# Patient Record
Sex: Female | Born: 1935 | Race: White | Hispanic: No | Smoking: Former smoker
Health system: Southern US, Community
[De-identification: ages and names within clinical notes are randomized; demographics above are authoritative.]

## PROBLEM LIST (undated history)

## (undated) DIAGNOSIS — R001 Bradycardia, unspecified: Secondary | ICD-10-CM

## (undated) DIAGNOSIS — E119 Type 2 diabetes mellitus without complications: Secondary | ICD-10-CM

## (undated) DIAGNOSIS — N189 Chronic kidney disease, unspecified: Secondary | ICD-10-CM

## (undated) DIAGNOSIS — F32A Depression, unspecified: Secondary | ICD-10-CM

## (undated) DIAGNOSIS — I10 Essential (primary) hypertension: Secondary | ICD-10-CM

## (undated) DIAGNOSIS — F329 Major depressive disorder, single episode, unspecified: Secondary | ICD-10-CM

## (undated) DIAGNOSIS — H3530 Unspecified macular degeneration: Secondary | ICD-10-CM

## (undated) DIAGNOSIS — M1990 Unspecified osteoarthritis, unspecified site: Secondary | ICD-10-CM

## (undated) DIAGNOSIS — E162 Hypoglycemia, unspecified: Secondary | ICD-10-CM

## (undated) DIAGNOSIS — E039 Hypothyroidism, unspecified: Secondary | ICD-10-CM

## (undated) DIAGNOSIS — G500 Trigeminal neuralgia: Secondary | ICD-10-CM

## (undated) HISTORY — PX: CHOLECYSTECTOMY: SHX55

## (undated) HISTORY — DX: Depression, unspecified: F32.A

## (undated) HISTORY — DX: Trigeminal neuralgia: G50.0

## (undated) HISTORY — PX: APPENDECTOMY: SHX54

## (undated) HISTORY — DX: Hypoglycemia, unspecified: E16.2

## (undated) HISTORY — DX: Unspecified osteoarthritis, unspecified site: M19.90

## (undated) HISTORY — DX: Unspecified macular degeneration: H35.30

## (undated) HISTORY — DX: Bradycardia, unspecified: R00.1

## (undated) HISTORY — DX: Major depressive disorder, single episode, unspecified: F32.9

## (undated) HISTORY — DX: Chronic kidney disease, unspecified: N18.9

## (undated) HISTORY — PX: TONSILLECTOMY: SUR1361

## (undated) HISTORY — DX: Hypothyroidism, unspecified: E03.9

## (undated) HISTORY — DX: Essential (primary) hypertension: I10

---

## 2001-08-02 ENCOUNTER — Encounter: Payer: Self-pay | Admitting: Family Medicine

## 2001-08-02 ENCOUNTER — Ambulatory Visit (HOSPITAL_COMMUNITY): Admission: RE | Admit: 2001-08-02 | Discharge: 2001-08-02 | Payer: Self-pay | Admitting: Family Medicine

## 2005-04-22 ENCOUNTER — Ambulatory Visit: Payer: Self-pay | Admitting: Cardiology

## 2005-04-27 ENCOUNTER — Ambulatory Visit: Payer: Self-pay | Admitting: Cardiology

## 2005-05-02 ENCOUNTER — Ambulatory Visit: Payer: Self-pay | Admitting: Cardiology

## 2010-11-28 ENCOUNTER — Inpatient Hospital Stay (HOSPITAL_COMMUNITY)
Admission: AD | Admit: 2010-11-28 | Discharge: 2010-12-08 | DRG: 308 | Disposition: A | Discharge status: Skilled Nursing Facility | Payer: Medicare Other | Source: Other Acute Inpatient Hospital | Attending: Internal Medicine | Admitting: Internal Medicine

## 2010-11-28 DIAGNOSIS — E039 Hypothyroidism, unspecified: Secondary | ICD-10-CM | POA: Diagnosis present

## 2010-11-28 DIAGNOSIS — G929 Unspecified toxic encephalopathy: Secondary | ICD-10-CM | POA: Diagnosis not present

## 2010-11-28 DIAGNOSIS — H3530 Unspecified macular degeneration: Secondary | ICD-10-CM | POA: Diagnosis present

## 2010-11-28 DIAGNOSIS — G500 Trigeminal neuralgia: Secondary | ICD-10-CM | POA: Diagnosis present

## 2010-11-28 DIAGNOSIS — M722 Plantar fascial fibromatosis: Secondary | ICD-10-CM | POA: Diagnosis present

## 2010-11-28 DIAGNOSIS — N184 Chronic kidney disease, stage 4 (severe): Secondary | ICD-10-CM | POA: Diagnosis present

## 2010-11-28 DIAGNOSIS — G92 Toxic encephalopathy: Secondary | ICD-10-CM | POA: Diagnosis not present

## 2010-11-28 DIAGNOSIS — I129 Hypertensive chronic kidney disease with stage 1 through stage 4 chronic kidney disease, or unspecified chronic kidney disease: Secondary | ICD-10-CM | POA: Diagnosis present

## 2010-11-28 DIAGNOSIS — E119 Type 2 diabetes mellitus without complications: Secondary | ICD-10-CM | POA: Diagnosis present

## 2010-11-28 DIAGNOSIS — Z888 Allergy status to other drugs, medicaments and biological substances status: Secondary | ICD-10-CM

## 2010-11-28 DIAGNOSIS — N390 Urinary tract infection, site not specified: Secondary | ICD-10-CM | POA: Diagnosis not present

## 2010-11-28 DIAGNOSIS — I498 Other specified cardiac arrhythmias: Principal | ICD-10-CM | POA: Diagnosis present

## 2010-11-28 DIAGNOSIS — Z880 Allergy status to penicillin: Secondary | ICD-10-CM

## 2010-11-28 DIAGNOSIS — Z79899 Other long term (current) drug therapy: Secondary | ICD-10-CM

## 2010-11-28 DIAGNOSIS — Z87891 Personal history of nicotine dependence: Secondary | ICD-10-CM

## 2010-11-28 DIAGNOSIS — Z882 Allergy status to sulfonamides status: Secondary | ICD-10-CM

## 2010-11-28 LAB — BASIC METABOLIC PANEL
BUN: 33 mg/dL — ABNORMAL HIGH (ref 6–23)
CO2: 28 mEq/L (ref 19–32)
Calcium: 11.8 mg/dL — ABNORMAL HIGH (ref 8.4–10.5)
Chloride: 100 mEq/L (ref 96–112)
Creatinine, Ser: 1.36 mg/dL — ABNORMAL HIGH (ref 0.50–1.10)
GFR calc Af Amer: 39 mL/min — ABNORMAL LOW (ref >90)
GFR calc non Af Amer: 34 mL/min — ABNORMAL LOW (ref >90)
Glucose, Bld: 150 mg/dL — ABNORMAL HIGH (ref 70–99)
Potassium: 3.7 mEq/L (ref 3.5–5.1)
Sodium: 139 mEq/L (ref 135–145)

## 2010-11-28 LAB — CARDIAC PANEL(CRET KIN+CKTOT+MB+TROPI)
CK, MB: 4.4 ng/mL — ABNORMAL HIGH (ref 0.3–4.0)
Relative Index: 2 (ref 0.0–2.5)
Total CK: 215 U/L — ABNORMAL HIGH (ref 7–177)
Troponin I: <0.3 ng/mL (ref <0.30)

## 2010-11-28 LAB — CBC
HCT: 36.4 % (ref 36.0–46.0)
Hemoglobin: 12.6 g/dL (ref 12.0–15.0)
MCH: 32.7 pg (ref 26.0–34.0)
MCHC: 34.6 g/dL (ref 30.0–36.0)
MCV: 94.5 fL (ref 78.0–100.0)
Platelets: 138 10*3/uL — ABNORMAL LOW (ref 150–400)
RBC: 3.85 MIL/uL — ABNORMAL LOW (ref 3.87–5.11)
RDW: 13.3 % (ref 11.5–15.5)
WBC: 6.6 10*3/uL (ref 4.0–10.5)

## 2010-11-28 LAB — TSH: TSH: 6.741 u[IU]/mL — ABNORMAL HIGH (ref 0.350–4.500)

## 2010-11-28 LAB — MRSA PCR SCREENING: MRSA by PCR: NEGATIVE

## 2010-11-28 LAB — GLUCOSE, CAPILLARY: Glucose-Capillary: 141 mg/dL — ABNORMAL HIGH (ref 70–99)

## 2010-11-29 DIAGNOSIS — R0989 Other specified symptoms and signs involving the circulatory and respiratory systems: Secondary | ICD-10-CM

## 2010-11-29 DIAGNOSIS — I059 Rheumatic mitral valve disease, unspecified: Secondary | ICD-10-CM

## 2010-11-29 DIAGNOSIS — I498 Other specified cardiac arrhythmias: Secondary | ICD-10-CM

## 2010-11-29 LAB — CBC
HCT: 33 % — ABNORMAL LOW (ref 36.0–46.0)
Hemoglobin: 11.3 g/dL — ABNORMAL LOW (ref 12.0–15.0)
MCV: 94.8 fL (ref 78.0–100.0)
RDW: 13.4 % (ref 11.5–15.5)
WBC: 6.2 10*3/uL (ref 4.0–10.5)

## 2010-11-29 LAB — T3, FREE: T3, Free: 2.2 pg/mL — ABNORMAL LOW (ref 2.3–4.2)

## 2010-11-29 LAB — BASIC METABOLIC PANEL
BUN: 32 mg/dL — ABNORMAL HIGH (ref 6–23)
Chloride: 104 mEq/L (ref 96–112)
Creatinine, Ser: 1.33 mg/dL — ABNORMAL HIGH (ref 0.50–1.10)
Glucose, Bld: 91 mg/dL (ref 70–99)
Potassium: 3.7 mEq/L (ref 3.5–5.1)

## 2010-11-29 LAB — COMPREHENSIVE METABOLIC PANEL
ALT: 11 U/L (ref 0–35)
AST: 31 U/L (ref 0–37)
Albumin: 2.8 g/dL — ABNORMAL LOW (ref 3.5–5.2)
Alkaline Phosphatase: 88 U/L (ref 39–117)
Chloride: 102 mEq/L (ref 96–112)
Potassium: 4 mEq/L (ref 3.5–5.1)
Sodium: 140 mEq/L (ref 135–145)
Total Bilirubin: 0.4 mg/dL (ref 0.3–1.2)
Total Protein: 6.2 g/dL (ref 6.0–8.3)

## 2010-11-29 LAB — T4, FREE: Free T4: 0.69 ng/dL — ABNORMAL LOW (ref 0.80–1.80)

## 2010-11-29 LAB — PROTIME-INR
INR: 1.08 (ref 0.00–1.49)
Prothrombin Time: 14.2 seconds (ref 11.6–15.2)

## 2010-11-29 LAB — GLUCOSE, CAPILLARY
Glucose-Capillary: 121 mg/dL — ABNORMAL HIGH (ref 70–99)
Glucose-Capillary: 117 mg/dL — ABNORMAL HIGH (ref 70–99)

## 2010-11-30 ENCOUNTER — Inpatient Hospital Stay (HOSPITAL_COMMUNITY): Payer: Medicare Other

## 2010-11-30 LAB — GLUCOSE, CAPILLARY
Glucose-Capillary: 128 mg/dL — ABNORMAL HIGH (ref 70–99)
Glucose-Capillary: 158 mg/dL — ABNORMAL HIGH (ref 70–99)

## 2010-12-01 DIAGNOSIS — M7989 Other specified soft tissue disorders: Secondary | ICD-10-CM

## 2010-12-01 DIAGNOSIS — M79609 Pain in unspecified limb: Secondary | ICD-10-CM

## 2010-12-01 LAB — URINALYSIS, ROUTINE W REFLEX MICROSCOPIC
Bilirubin Urine: NEGATIVE
Ketones, ur: 15 mg/dL — AB
Nitrite: NEGATIVE
Protein, ur: 30 mg/dL — AB
pH: 6 (ref 5.0–8.0)

## 2010-12-01 LAB — URINE MICROSCOPIC-ADD ON

## 2010-12-01 LAB — CBC
MCHC: 34.8 g/dL (ref 30.0–36.0)
MCV: 93.1 fL (ref 78.0–100.0)
Platelets: 138 10*3/uL — ABNORMAL LOW (ref 150–400)
RDW: 12.8 % (ref 11.5–15.5)
WBC: 13.1 10*3/uL — ABNORMAL HIGH (ref 4.0–10.5)

## 2010-12-01 LAB — BASIC METABOLIC PANEL
Calcium: 10 mg/dL (ref 8.4–10.5)
Chloride: 100 mEq/L (ref 96–112)
Creatinine, Ser: 1.08 mg/dL (ref 0.50–1.10)
GFR calc Af Amer: 52 mL/min — ABNORMAL LOW (ref >90)
GFR calc non Af Amer: 44 mL/min — ABNORMAL LOW (ref >90)

## 2010-12-01 LAB — GLUCOSE, CAPILLARY
Glucose-Capillary: 160 mg/dL — ABNORMAL HIGH (ref 70–99)
Glucose-Capillary: 155 mg/dL — ABNORMAL HIGH (ref 70–99)
Glucose-Capillary: 154 mg/dL — ABNORMAL HIGH (ref 70–99)
Glucose-Capillary: 175 mg/dL — ABNORMAL HIGH (ref 70–99)

## 2010-12-02 DIAGNOSIS — I4891 Unspecified atrial fibrillation: Secondary | ICD-10-CM

## 2010-12-02 LAB — URINE CULTURE
Colony Count: >=100000
Culture  Setup Time: 201210100938

## 2010-12-02 LAB — GLUCOSE, CAPILLARY
Glucose-Capillary: 184 mg/dL — ABNORMAL HIGH (ref 70–99)
Glucose-Capillary: 169 mg/dL — ABNORMAL HIGH (ref 70–99)

## 2010-12-02 LAB — CBC
MCH: 32.2 pg (ref 26.0–34.0)
MCHC: 34.7 g/dL (ref 30.0–36.0)
Platelets: 139 10*3/uL — ABNORMAL LOW (ref 150–400)
RBC: 3.39 MIL/uL — ABNORMAL LOW (ref 3.87–5.11)
RDW: 13.1 % (ref 11.5–15.5)

## 2010-12-02 LAB — BASIC METABOLIC PANEL
Calcium: 9.5 mg/dL (ref 8.4–10.5)
GFR calc Af Amer: 52 mL/min — ABNORMAL LOW (ref >90)
GFR calc non Af Amer: 45 mL/min — ABNORMAL LOW (ref >90)
Sodium: 140 mEq/L (ref 135–145)

## 2010-12-03 DIAGNOSIS — I495 Sick sinus syndrome: Secondary | ICD-10-CM

## 2010-12-03 LAB — GLUCOSE, CAPILLARY
Glucose-Capillary: 134 mg/dL — ABNORMAL HIGH (ref 70–99)
Glucose-Capillary: 174 mg/dL — ABNORMAL HIGH (ref 70–99)
Glucose-Capillary: 186 mg/dL — ABNORMAL HIGH (ref 70–99)

## 2010-12-03 LAB — DIFFERENTIAL
Basophils Absolute: 0 10*3/uL (ref 0.0–0.1)
Basophils Relative: 0 % (ref 0–1)
Lymphocytes Relative: 24 % (ref 12–46)
Monocytes Absolute: 0.7 10*3/uL (ref 0.1–1.0)
Monocytes Relative: 8 % (ref 3–12)
Neutro Abs: 6.4 10*3/uL (ref 1.7–7.7)
Neutrophils Relative %: 67 % (ref 43–77)

## 2010-12-03 LAB — BASIC METABOLIC PANEL
BUN: 20 mg/dL (ref 6–23)
CO2: 27 mEq/L (ref 19–32)
Chloride: 101 mEq/L (ref 96–112)
GFR calc non Af Amer: 47 mL/min — ABNORMAL LOW (ref >90)
Glucose, Bld: 138 mg/dL — ABNORMAL HIGH (ref 70–99)
Potassium: 3.5 mEq/L (ref 3.5–5.1)
Sodium: 137 mEq/L (ref 135–145)

## 2010-12-03 LAB — CBC
HCT: 30.9 % — ABNORMAL LOW (ref 36.0–46.0)
Hemoglobin: 10.8 g/dL — ABNORMAL LOW (ref 12.0–15.0)
MCHC: 35 g/dL (ref 30.0–36.0)
RBC: 3.36 MIL/uL — ABNORMAL LOW (ref 3.87–5.11)

## 2010-12-04 LAB — GLUCOSE, CAPILLARY: Glucose-Capillary: 142 mg/dL — ABNORMAL HIGH (ref 70–99)

## 2010-12-05 LAB — GLUCOSE, CAPILLARY
Glucose-Capillary: 160 mg/dL — ABNORMAL HIGH (ref 70–99)
Glucose-Capillary: 186 mg/dL — ABNORMAL HIGH (ref 70–99)
Glucose-Capillary: 190 mg/dL — ABNORMAL HIGH (ref 70–99)
Glucose-Capillary: 170 mg/dL — ABNORMAL HIGH (ref 70–99)

## 2010-12-06 LAB — GLUCOSE, CAPILLARY
Glucose-Capillary: 150 mg/dL — ABNORMAL HIGH (ref 70–99)
Glucose-Capillary: 161 mg/dL — ABNORMAL HIGH (ref 70–99)

## 2010-12-07 ENCOUNTER — Inpatient Hospital Stay (HOSPITAL_COMMUNITY): Payer: Medicare Other

## 2010-12-07 ENCOUNTER — Telehealth: Payer: Self-pay | Admitting: Internal Medicine

## 2010-12-07 LAB — GLUCOSE, CAPILLARY
Glucose-Capillary: 154 mg/dL — ABNORMAL HIGH (ref 70–99)
Glucose-Capillary: 143 mg/dL — ABNORMAL HIGH (ref 70–99)

## 2010-12-07 LAB — CULTURE, BLOOD (ROUTINE X 2): Culture: NO GROWTH

## 2010-12-07 NOTE — Telephone Encounter (Signed)
Pt daughter calling to speak with Dr. Graciela Husbands and/or nurse. Please call back.

## 2010-12-07 NOTE — Telephone Encounter (Signed)
I spoke with the patient's daughter. She is returning a call to Dr. Graciela Husbands regarding her mother.  She is scheduled for d/c tomorrow. Please call at contact number left in message. I will forward to Dr. Graciela Husbands.

## 2010-12-08 LAB — GLUCOSE, CAPILLARY: Glucose-Capillary: 166 mg/dL — ABNORMAL HIGH (ref 70–99)

## 2010-12-12 NOTE — Consult Note (Signed)
NAME:  Jodi Ferguson, Jodi Ferguson NO.:  [id number]  MEDICAL RECORD NO.:  [id number]  LOCATION:  2923                         FACILITY:  [facility]  PHYSICIAN:  Thana Farr, MD    DATE OF BIRTH:  May 31, 1920  DATE OF CONSULTATION:  11/29/2010 DATE OF DISCHARGE:                                CONSULTATION   REASON FOR CONSULTATION:  Bradycardia with a question of the cause could be Lyrica.  HISTORY OF PRESENT ILLNESS:  This is an 77 year old female with past medical history of hypertension, trigeminal neuralgia, macular degeneration, chronic kidney disease, depression, osteoarthritis and bradycardia resulting in discontinued beta-blocker just recently.  The patient has had trigeminal neuralgia for multiple years per family members.  However, approximately about 4 months ago, the patient was increased from Lyrica 50 mg daily to 50 mg b.i.d.  At approximately 1 month ago, she was increased to Lyrica 50 mg 2 tabs in the morning and 2 tabs at night.  In addition, the patient was also recently taken off the metformin secondary to worsening renal function and her pain medication of OxyContin was changed from 2 tabs at night and 1 tab in the morning and 1 tab in the evening.  The patient was recently seen by her primary care physician after there was noted bradycardia and syncopal episodes. At that time, there is question of increased dosage of Lyrica could be causing his bradycardia.  The patient was transferred to [facility] for further evaluation by cardiologist.  Neurology was asked to see the patient and evaluate for possible medication side effects of Lyrica.  PAST MEDICAL HISTORY:  As noted above.  MEDICATIONS:  She is on vancomycin, Benicar, Lyrica 50 mg p.o. daily at this time, Lasix, NovoLog and gentamicin.  ALLERGIES:  PENICILLIN, CODEINE and SULFA.  SOCIAL HISTORY:  The patient does not smoke, drink or do illicit drugs. She lives alone.  REVIEW OF SYSTEMS:  Negative  with the exception of above.  PHYSICAL EXAMINATION:  Blood pressure ranges between systolic 94-163/37- 52, pulse 38, respirations 20, temperature 98.4 to 97.7.  The patient is alert and oriented x3.  Carries out 2-3 step commands with difficulty. Pupils are equal, round, reactive to light and accommodating conjugate, extraocular movements intact, visual fields grossly intact.  Face symmetrical.  Tongue is midline.  Uvula is midline.  Facial sensation is full to pinprick, light touch, shoulder shrug and head turn is within normal limits.  Coordination, finger-to-nose, heel-to-shin is smooth. Motor is 5/5 throughout.  Deep tendon reflexes 2+ throughout downgoing toes.  The patient shows no drift in the upper or lower extremities. Pulmonary is clear to auscultation bilaterally.  Cardiovascular, S1-S2 is audible.  Sensation is full to pinprick light touch.  LABORATORY DATA:  Calcium is 10.8, TSH is 6.741, sodium 143 potassium 3.7 chloride 104, CO2 is 31 BUN 32, creatinine 1.33, glucose ranged from 91-150.  White blood cell count is 6.2, platelets 128, hemoglobin 11.3 and hematocrit 33.0.  IMAGING:  Carotid Dopplers are negative.  The patient has had 2 recent MRI of both on the 5th and November 28, 2010, which showed no acute stroke.  ASSESSMENT:  This is an 77 year old female who has been on Lyrica for  multiple years.  However, has recently increased the dose to 100 mg b.i.d. in the setting of decreased renal function and changing the schedule of her narcotics from 2 pills at night to one in the morning and one in the evening.  The patient has been noted to have increased bradycardia and syncopal episodes.  Lyrica can cause bradycardia.  Recomendations: 1. Start Tegretol 100 mg b.i.d. p.o.  2. Discontinue Lyrica  3. Obtain    LFTs to confirm that her liver function is within normal limits.     4. If the patient begins to improve on Tegretol, we may increase her dose slowly over a     period of weeks.    Dr. Thad Ranger was seen and evaluated this patient.  She agrees to the above mentioned.     Felicie Morn, PA-C   ______________________________ Thana Farr, MD    DS/MEDQ  D:  11/29/2010  T:  11/30/2010  Job:  [id number]  Electronically Signed by Felicie Morn PA-C on 12/01/2010 02:40:05 PM Electronically Signed by Thana Farr MD on 12/12/2010 10:11:53 AM

## 2010-12-16 NOTE — Discharge Summary (Addendum)
  NAMEMarland Kitchen  RAYNA, BRENNER NO.:  [id number]  MEDICAL RECORD NO.:  [id number]  LOCATION:  2036                         FACILITY:  [facility]  PHYSICIAN:  Duke Salvia, MD, FACCDATE OF BIRTH:  Oct 29, 1920  DATE OF ADMISSION:  11/28/2010 DATE OF DISCHARGE:                              DISCHARGE SUMMARY   ADDENDUM:  DISCHARGE MEDICATIONS: 1. Amlodipine 10 mg daily. 2. Levothyroxine 25 mcg daily before breakfast. 3. Benicar 20 mg daily. 4. Lasix 20 mg daily. 5. Calcium carbonate/vitamin D 600 mg OTC 1 tablet daily. 6. Fish oil OTC 1 capsule daily. 7. Hydrocodone/APAP 5/325 mg 1 tablet b.i.d. p.r.n. pain. 8. I-Caps 1 tablet daily. 9. Multivitamin 1 tablet daily. 10.Vitamin E OTC 1 capsule daily.     Ronie Spies, P.A.C.   ______________________________ Duke Salvia, MD, [facility]    DD/MEDQ  D:  12/08/2010  T:  12/08/2010  Job:  [id number]  cc:   [facility]  Electronically Signed by Ronie Spies  on 12/16/2010 10:27:25 AM Electronically Signed by Sherryl Manges MD FACC on 12/17/2010 07:27:03 AM

## 2010-12-16 NOTE — Discharge Summary (Addendum)
NAMEMarland Kitchen  Jodi Ferguson, Jodi Ferguson NO.:  [id number]  MEDICAL RECORD NO.:  [id number]  LOCATION:  2036                         FACILITY:  [facility]  PHYSICIAN:  Duke Salvia, MD, FACCDATE OF BIRTH:  1920-10-18  DATE OF ADMISSION:  11/28/2010 DATE OF DISCHARGE:  12/08/2010                              DISCHARGE SUMMARY   DISCHARGE DIAGNOSES: 1. Bradycardia, resolved. 2. Trigeminal neuralgia. 3. Foot pain felt consistent with plantar fasciitis. 4. Acute mental status secondary to toxic metabolic encephalopathy. 5. Urinary tract infection, treated with ciprofloxacin. 6. Newly diagnosed hypothyroidism, initiated on levothyroxine. 7. Hypertension. 8. Diabetes mellitus. 9. Macular degeneration. 10.Chronic kidney disease, stage 4.  HOSPITAL COURSE:  Jodi Ferguson is an 77 year old lady with a history of hypertension and diabetes who presented initially to [facility] with complaints of weakness and possible falls.  She was seen as an outpatient and had atrophy shown on MRI with question of carotid stenosis.  She was also found to have a heart rate that was low, so beta- blocker was discontinued.  However, upon arrival to the [facility],  heart rate was persistently low, as low as 35.  Blood pressure was continued to be okay in the 130s-140s, however, the patient was symptomatic.  She was felt to require further evaluation by Electrophysiology and thus transferred over to [facility] for further evaluation.  Carotid Dopplers were negative.  The patient's daughter had discussed with Dr. Graciela Husbands that the patient's recent decline in functional status correlated with increase in her Lyrica.  Thus, she was seen by Neurology for possible alternatives to her treatment for trigeminal neuralgia, they recommended to discontinue the Lyrica and initiate Tegretol.  During this hospitalization, the patient had several other issues.  She had some lower extremity warmth and  pain.  Bilateral lower extremity Dopplers were negative and she was initially started on antibiotics for cellulitis, but the Hospitalist Service does not feel that her physical exam was consistent with cellulitis.  They discontinued vancomycin, however, did note a UTI in her urinalysis and she was placed on Cipro for 5 days.  She had evidence of toxic metabolic encephalopathy which was felt to be contributing to her altered mental status, and it was suspected that this was possibly aggravated by her UTI plus or minus Tegretol which was also discontinued.  Her mental status began to improve and her bradycardia improved as well.  Echo was noted to show LV dysfunction by Dr. Graciela Husbands which is being confirmed at this time.  She was also seen by Physical Therapy and Orthopedic regarding foot pain and felt to have plantar fasciitis with instructions for avoiding NSAIDS because of renal function and following up with her primary care doctor in [place].  They also feel that she would benefit from stretching exercises daily and would avoid IV secondary to probable diabetic neuropathy.  After evaluation by PT, it was felt that the patient would benefit from SNF placement.  Bed became available today and the patient was able to ambulate.  She denies any chest pain, shortness of breath.  Dr. Graciela Husbands has seen and examined her today and feels she is stable for discharge.  DISCHARGE LABORATORY DATA:  Sodium 135, potassium 3.5,  chloride 101, CO2 21, glucose 138, BUN 20, creatinine 1.03.  WBC 9.5, hemoglobin 10.8, hematocrit 40.9, and platelet count 143.  Blood cultures were negative. Urine culture appeared contaminated although initially was felt to represent a UTI by the Hospitalist Team.  TSH 6.741 with free T4 of 0.69 and free T3 of 2.2.  Please note her hypothyroidism was also a new diagnosis.  STUDIES: 1. Foot x-ray, December 07, 2010 showed osteopenia without acute     abnormality. 2. 2-D  echocardiogram, results are pending at this time.  DISCHARGE MEDICATION:  To be dictated separately after discussion with Dr. Graciela Husbands.  DISPOSITION:  Jodi Ferguson will be discharged in stable condition to the skilled nursing facility.  She is to follow a low-sodium, heart- healthy diet, and keep her IV site clean and dry.  See above for Orthopedic Service recommendations.  Our office will call her in the [facility] in [place] building for an appointment for followup. She is also to follow up with PCP regarding her foot pain and further monitoring of her thyroid function.  DURATION OF DISCHARGE ENCOUNTER:  Greater than 30 minutes including physician and PA time.     Ronie Spies, P.A.C.   ______________________________ Duke Salvia, MD, [facility]    DD/MEDQ  D:  12/08/2010  T:  12/08/2010  Job:  [id number]  cc:   [facility]  Electronically Signed by Ronie Spies  on 12/16/2010 10:27:21 AM Electronically Signed by Sherryl Manges MD FACC on 12/17/2010 07:26:59 AM

## 2010-12-21 NOTE — Consult Note (Signed)
NAMEMarland Kitchen  SRINIDHI, LANDERS NO.:  [id number]  MEDICAL RECORD NO.:  [id number]  LOCATION:  2923                         FACILITY:  [facility]  PHYSICIAN:  Peggye Pitt, M.D. DATE OF BIRTH:  [birth year]  DATE OF CONSULTATION:  12/01/2010 DATE OF DISCHARGE:                                CONSULTATION   REQUESTING PHYSICIAN:  Dr. Graciela Husbands with [facility].  REASON FOR CONSULTATION:  Mrs. Bethards is an 77 year old white woman who was transferred from [facility] on October 7th after being found to have a profound asymptomatic bradycardia.  Thoughts were that she would probably need a permanent pacemaker.  However, further workup has shown that she had recently had an aggressive increase in her Lyrica dose that could have precipitated her bradycardia.  Neurology was consulted.  Lyrica was discontinued and she was started on Tegretol 100 mg twice daily for her trigeminal neurology.  She has not had any further episodes of jaw or face pain since.  It has been noted over the course of the past 24 hours, the patient has become more lethargic with low-grade temperatures and because of this, Dr. Graciela Husbands has requested our assistance with consultation.  Per his note today, it appears that he has started vancomycin for potential right foot cellulitis.  Dopplers of that extremity have been done and have returned negative for DVT.  She continues to have sinus bradycardia, although her heart rate has improved to the mid to high 60s.  ALLERGIES:  She has stated allergies to PENICILLIN.  PAST MEDICAL HISTORY:  Significant for trigeminal neuralgia, hypertension, and diabetes mellitus as per daughter.  CURRENT MEDICATIONS: 1. Tegretol 100 mg twice daily. 2. Lasix 20 mg daily. 3. NovoLog sliding scale. 4. Synthroid 25 mcg daily. 5. Multivitamin 1 tablet daily. 6. Benicar 20 mg daily. 7. Vancomycin per pharmacy protocol.  SOCIAL HISTORY:  She used to be a smoker, quit  over 50 years ago.  No alcohol or illicit drug use per daughter's report.  The patient is too lethargic to answer questions at this time.  FAMILY HISTORY:  Noncontributory as per daughter's recollection.  REVIEW OF SYSTEMS:  I am unable to obtain given lethargy of the patient.  PHYSICAL EXAM:  VITALS ON DAY OF CONSULTATION:  Blood pressure 153/49, heart rate 60, respirations 24, sats of 93% on 2 L with a T-max of 99.3. GENERAL:  She is lethargic, arouses to a vigorous sternal rub, however, cannot answer questions. NECK:  Supple.  No JVD.  No lymphadenopathy.  No bruits.  No goiter. HEART:  Bradycardic, but with a regular rhythm.  No murmurs, rubs, or gallops that I could not auscultate. LUNGS:  Clear to auscultation bilaterally.  ABDOMEN:  Soft, nontender, nondistended with positive bowel sounds. EXTREMITIES:  She has right greater than left lower extremity edema.  No erythema noted.  LABORATORY DATA:  Labs today include a sodium of 138, potassium 3.7, chloride 100, bicarb 27, BUN 19, creatinine 1.08, glucose of 153.  WBCs 13.1, hemoglobin 12.7, and a platelet count of 138.  A chest x-ray on October 9th that shows bibasilar atelectasis and cardiomegaly, but otherwise no acute disease.  A right foot x-ray that shows a remote 5th metatarsal  fracture.  There is severe osteopenia.  Soft tissue swelling about the midfoot and forefoot, however, no evidence of acute or subacute fracture.  ASSESSMENT/PLAN: 1. Metabolic encephalopathy. 2. Urinary tract infection. 3. Sinus bradycardia, improving, off of Lyrica. 4. New diagnosis of hypothyroidism. 5. Trigeminal neuralgia. 6. Diabetes mellitus.  PLAN:  At this point, I see no evidence of cellulitis of her leg.  I will elect to discontinue vancomycin at this time.  She does have signs of UTI on her urinalysis.  I will place her on IV Cipro pending culture data.  However, I believe that the main cause for her encephalopathy is the Tegretol  that was recently started 2 days ago.  This would coincide with the beginning of her altered mentation.  I have discussed this with Dr. Thad Ranger with Neurology and we have decided to discontinue her Tegretol at this time to see if this helps clear up her encephalopathy.  Thank you for this consultation and we will follow along with you.     Peggye Pitt, M.D.     EH/MEDQ  D:  12/01/2010  T:  12/01/2010  Job:  [id number]  cc:   Duke Salvia, MD, [facility]  Electronically Signed by Peggye Pitt M.D. on 12/21/2010 06:20:54 PM

## 2010-12-28 ENCOUNTER — Encounter: Payer: Self-pay | Admitting: *Deleted

## 2010-12-28 ENCOUNTER — Encounter: Payer: 59 | Admitting: Cardiovascular Disease

## 2010-12-31 ENCOUNTER — Ambulatory Visit (INDEPENDENT_AMBULATORY_CARE_PROVIDER_SITE_OTHER): Payer: Medicare Other | Admitting: Cardiovascular Disease

## 2010-12-31 ENCOUNTER — Encounter: Payer: Self-pay | Admitting: Cardiovascular Disease

## 2010-12-31 DIAGNOSIS — I498 Other specified cardiac arrhythmias: Secondary | ICD-10-CM

## 2010-12-31 DIAGNOSIS — I10 Essential (primary) hypertension: Secondary | ICD-10-CM

## 2010-12-31 DIAGNOSIS — R001 Bradycardia, unspecified: Secondary | ICD-10-CM | POA: Insufficient documentation

## 2010-12-31 NOTE — Progress Notes (Signed)
HPI  This is an 77 year old female who is here today for a followup visit after recent hospitalization. The patient initially presented to [facility] emergency room with recurrent falls. She was noted to be bradycardic with a heart rate of 35 beats per minute. She was transferred to [facility]. She did not have an acute coronary syndrome. The falls were felt to be possibly due to Lyrica which was discontinued. She was started on Tegretol instead for trigeminal neuralgia which was discontinued later due to mental status changes. His bradycardia was felt to be medication related as well as newly diagnosed hypothyroidism. Metoprolol was discontinued and she was started on thyroid replacement therapy. Her bradycardia resolved. She also had urinary tract infection. She had an echocardiogram done which showed normal LV systolic function without significant valvular abnormalities. She was discharged to skilled nursing facility where she stayed for about a week. She is now back home. She denies any chest pain. There is no dizziness, syncope or presyncope.  Allergies  Allergen Reactions  . Codeine Nausea Only  . Penicillins Rash  . Sulfa Antibiotics Rash     Current Outpatient Prescriptions on File Prior to Visit  Medication Sig Dispense Refill  . HYDROcodone-acetaminophen (NORCO) 5-325 MG per tablet Take 1 tablet by mouth 2 (two) times daily as needed.        Marland Kitchen levothyroxine (SYNTHROID, LEVOTHROID) 25 MCG tablet Take 25 mcg by mouth daily.        . Multiple Vitamins-Minerals (ICAPS PO) Take 1 tablet by mouth daily.           Past Medical History  Diagnosis Date  . Osteoarthritis   . Chronic depressive disorder   . Trigeminal neuralgia   . Diabetic retinopathy   . Macular degeneration   . Bradycardia     betablocker stopped due to this. Mild hypothyroidism  . Diabetes mellitus   . Hypoglycemia     requiring hospitalization and mediation adjustment,sulfonylurea stopped  . Chronic kidney  disease     stage IV  . Hypothyroidism   . Hypertension      Past Surgical History  Procedure Date  . Cholecystectomy   . Appendectomy      History reviewed. No pertinent family history.   History   Social History  . Marital Status: Married    Spouse Name: N/A    Number of Children: N/A  . Years of Education: N/A   Occupational History  . RETIRED    Social History Main Topics  . Smoking status: Former Smoker -- 1.0 packs/day for 30 years    Types: Cigarettes    Quit date: 02/21/1958  . Smokeless tobacco: Never Used  . Alcohol Use: No  . Drug Use: Not on file  . Sexually Active: Not on file   Other Topics Concern  . Not on file   Social History Narrative   Spent a great deal of her time taking care of her husband whom has dementia and bipolar.     PHYSICAL EXAM   BP 177/79  Pulse 82  Ht 4\' 10"  (1.473 m)  Wt 144 lb (65.318 kg)  BMI 30.10 kg/m2  Constitutional: She is oriented to person, place, and time. She appears well-developed and well-nourished. No distress.  HENT: No nasal discharge.  Head: Normocephalic and atraumatic.  Eyes: Pupils are equal, round, and reactive to light. Right eye exhibits no discharge. Left eye exhibits no discharge.  Neck: Normal range of motion. Neck supple. No JVD present. No thyromegaly  present.  Cardiovascular: Normal rate, regular rhythm, normal heart sounds. Exam reveals no gallop and no friction rub.  No murmur heard.  Pulmonary/Chest: Effort normal and breath sounds normal. No stridor. No respiratory distress. She has no wheezes. She has no rales. She exhibits no tenderness.  Abdominal: Soft. Bowel sounds are normal. She exhibits no distension. There is no tenderness. There is no rebound and no guarding.  Musculoskeletal: Normal range of motion. She exhibits trace edema and no tenderness.  Neurological: She is alert and oriented to person, place, and time. Coordination normal.  Skin: Skin is warm and dry. No rash noted.  She is not diaphoretic. No erythema. No pallor.  Psychiatric: She has a normal mood and affect. Her behavior is normal. Judgment and thought content normal.      ASSESSMENT AND PLAN

## 2010-12-31 NOTE — Assessment & Plan Note (Signed)
This was likely due to metoprolol and mild hypothyroidism. It is completely resolved now. Her echocardiogram showed normal LV systolic function. I recommend avoiding agents that can affect AV conduction such as beta blockers or calcium channel blockers. Continue to treat the hypothyroidism and adjust the dose as needed. She follows up with Dr. Dimas Aguas for this.  There is no indication for a permanent pacemaker. The patient can followup with [place] as needed.

## 2010-12-31 NOTE — Assessment & Plan Note (Signed)
Her blood pressure is elevated today. I reviewed her home blood pressure readings which were all below 150 systolic. Thus, no changes will be made today. If her blood pressure remains elevated, consider adding amlodipine.

## 2010-12-31 NOTE — Patient Instructions (Signed)
Continue all current medications. Follow up as needed  

## 2011-06-13 ENCOUNTER — Emergency Department (HOSPITAL_COMMUNITY)
Admission: EM | Admit: 2011-06-13 | Discharge: 2011-06-13 | Disposition: A | Discharge status: Home / Self Care | Payer: Medicare Other | Attending: Emergency Medicine | Admitting: Emergency Medicine

## 2011-06-13 ENCOUNTER — Encounter (HOSPITAL_COMMUNITY): Payer: Self-pay | Admitting: *Deleted

## 2011-06-13 DIAGNOSIS — F329 Major depressive disorder, single episode, unspecified: Secondary | ICD-10-CM | POA: Insufficient documentation

## 2011-06-13 DIAGNOSIS — F3289 Other specified depressive episodes: Secondary | ICD-10-CM | POA: Insufficient documentation

## 2011-06-13 DIAGNOSIS — N184 Chronic kidney disease, stage 4 (severe): Secondary | ICD-10-CM | POA: Insufficient documentation

## 2011-06-13 DIAGNOSIS — Z79899 Other long term (current) drug therapy: Secondary | ICD-10-CM | POA: Insufficient documentation

## 2011-06-13 DIAGNOSIS — G500 Trigeminal neuralgia: Secondary | ICD-10-CM | POA: Insufficient documentation

## 2011-06-13 DIAGNOSIS — E119 Type 2 diabetes mellitus without complications: Secondary | ICD-10-CM | POA: Insufficient documentation

## 2011-06-13 DIAGNOSIS — I129 Hypertensive chronic kidney disease with stage 1 through stage 4 chronic kidney disease, or unspecified chronic kidney disease: Secondary | ICD-10-CM | POA: Insufficient documentation

## 2011-06-13 LAB — GLUCOSE, CAPILLARY: Glucose-Capillary: 84 mg/dL (ref 70–99)

## 2011-06-13 MED ORDER — HYDROMORPHONE HCL PF 1 MG/ML IJ SOLN
1.0000 mg | Freq: Once | INTRAMUSCULAR | Status: AC
  Administered 2011-06-13: 1 mg via INTRAMUSCULAR
  Filled 2011-06-13: qty 1

## 2011-06-13 MED ORDER — GABAPENTIN 100 MG PO CAPS: 200.0000 mg | ORAL_CAPSULE | Freq: Every day | ORAL | Status: AC

## 2011-06-13 MED ORDER — OXYCODONE-ACETAMINOPHEN 10-325 MG PO TABS: 1.0000 | ORAL_TABLET | Freq: Three times a day (TID) | ORAL | Status: AC | PRN

## 2011-06-13 NOTE — Discharge Instructions (Signed)
Trigeminal Neuralgia Trigeminal neuralgia is a nerve disorder that causes sudden attacks of severe facial pain. It is caused by damage to the trigeminal nerve, a major nerve in the face. It is more common in women and in the elderly, although it can also happen in younger patients. Attacks last from a few seconds to several minutes and can occur from a couple of times per year to several times per day. Trigeminal neuralgia can be a very distressing and disabling condition. Surgery may be needed in very severe cases if medical treatment does not give relief. HOME CARE INSTRUCTIONS   If your caregiver prescribed medication to help prevent attacks, take as directed.   To help prevent attacks:   Chew on the unaffected side of the mouth.   Avoid touching your face.   Avoid blasts of hot or cold air.   Men may wish to grow a beard to avoid having to shave.  SEEK IMMEDIATE MEDICAL CARE IF:  Pain is unbearable and your medicine does not help.   You develop new, unexplained symptoms (problems).   You have problems that may be related to a medication you are taking.  Document Released: 02/05/2000 Document Revised: 01/27/2011 Document Reviewed: 12/05/2008 [employer] Patient Information 2012 [employer]. 

## 2011-06-13 NOTE — ED Notes (Signed)
Hx of trigeminal neuralgia, facial and neck pain,.  Seen at [facility] and given at shot. Has appt to see neuro surgeon, Dr Newell Coral. On Friday.

## 2011-06-13 NOTE — ED Provider Notes (Signed)
History     CSN: [id number]  Arrival date & time 06/13/11  1434   First MD Initiated Contact with Patient 06/13/11 1731      Chief Complaint  Patient presents with  . Facial Pain    (Consider location/radiation/quality/duration/timing/severity/associated sxs/prior treatment) The history is provided by the patient.   acute on chronic right-sided facial pain. She has a history of trigeminal neuralgia. She's been on Lyrica, but had to stop it due to bradycardia. She is now on Neurontin, but does not work nearly as well. She's also on Norco. She's taken 2 and half tablets in the morning and 2 and half tablets in the evening as needed. This does not relieve the pain enough. No new injury. She has an appointment to see Dr. Newell Coral on Friday. No fevers.  Past Medical History  Diagnosis Date  . Osteoarthritis   . Chronic depressive disorder   . Trigeminal neuralgia   . Diabetic retinopathy   . Macular degeneration   . Bradycardia     betablocker stopped due to this. Mild hypothyroidism  . Diabetes mellitus   . Hypoglycemia     requiring hospitalization and mediation adjustment,sulfonylurea stopped  . Chronic kidney disease     stage IV  . Hypothyroidism   . Hypertension     Past Surgical History  Procedure Date  . Cholecystectomy   . Appendectomy   . Tonsillectomy     No family history on file.  History  Substance Use Topics  . Smoking status: Former Smoker -- 1.0 packs/day for 30 years    Types: Cigarettes    Quit date: 02/21/1958  . Smokeless tobacco: Never Used  . Alcohol Use: No    OB History    Grav Para Term Preterm Abortions TAB SAB Ect Mult Living                  Review of Systems  Constitutional: Negative for diaphoresis and fatigue.  HENT: Negative for ear pain, facial swelling, sinus pressure and ear discharge.   Eyes: Negative for pain, redness and itching.  Respiratory: Negative for cough.     Allergies  Codeine; Penicillins; and Sulfa  antibiotics  Home Medications   Current Outpatient Rx  Name Route Sig Dispense Refill  . ACETAMINOPHEN 500 MG PO TABS Oral Take 1,000 mg by mouth daily.    . ASPIRIN EC 81 MG PO TBEC Oral Take 81 mg by mouth every morning.    . SUPER B-COMPLEX PO Oral Take 1 tablet by mouth daily.    Marland Kitchen BENICAR 40 MG PO TABS Oral Take 1 tablet by mouth Daily.    Marland Kitchen CALCIUM CARBONATE 600 MG PO TABS Oral Take 600 mg by mouth at bedtime.    Marland Kitchen CITALOPRAM HYDROBROMIDE 10 MG PO TABS Oral Take 1 tablet by mouth at bedtime.     . FUROSEMIDE 40 MG PO TABS Oral Take 1 tablet by mouth every morning.     Marland Kitchen GLIPIZIDE ER 2.5 MG PO TB24 Oral Take 1 tablet by mouth every morning.     Marland Kitchen HYDROCODONE-ACETAMINOPHEN 5-325 MG PO TABS Oral Take 2-2.5 tablets by mouth 2 (two) times daily as needed. Take two and one-half tablets by mouth in the morning and one to two tablets at bedtime as needed For pain    . LEVOTHYROXINE SODIUM 25 MCG PO TABS Oral Take 25 mcg by mouth every morning.     Marland Kitchen LORATADINE 10 MG PO TABS Oral Take 10 mg by mouth  at bedtime.    . ICAPS PO Oral Take 1 tablet by mouth daily.      Marland Kitchen GABAPENTIN 100 MG PO CAPS Oral Take 2 capsules (200 mg total) by mouth at bedtime. 20 capsule 0  . OXYCODONE-ACETAMINOPHEN 10-325 MG PO TABS Oral Take 1-2 tablets by mouth every 8 (eight) hours as needed for pain. 30 tablet 0    BP 148/49  Pulse 59  Resp 18  Wt 140 lb (63.504 kg)  SpO2 99%  Physical Exam  Constitutional: She appears well-developed and well-nourished.  HENT:  Head: Normocephalic.       Tenderness to left side of face. No skin changes. No mass. No edema. No rash. Strength and sensation are intact. No tenderness over teeth  Eyes: Pupils are equal, round, and reactive to light.  Neck: Normal range of motion.  Cardiovascular: Normal rate.   Pulmonary/Chest: Effort normal and breath sounds normal.    ED Course  Procedures (including critical care time)  Labs Reviewed - No data to display No results  found.   1. Trigeminal neuralgia       MDM  Acute on chronic pain due to trigeminal neuralgia. No relief was injected pain medicines here. Will increase pain control at home. She has neurosurgery followup on Friday.        Juliet Rude. Rubin Payor, MD 06/13/11 1950

## 2013-10-22 ENCOUNTER — Inpatient Hospital Stay (HOSPITAL_COMMUNITY)
Admission: AD | Admit: 2013-10-22 | Discharge: 2013-10-30 | DRG: 292 | Disposition: A | Discharge status: Skilled Nursing Facility | Payer: Medicare Other | Source: Other Acute Inpatient Hospital | Attending: Internal Medicine | Admitting: Internal Medicine

## 2013-10-22 ENCOUNTER — Inpatient Hospital Stay (HOSPITAL_COMMUNITY): Payer: Medicare Other

## 2013-10-22 DIAGNOSIS — I252 Old myocardial infarction: Secondary | ICD-10-CM | POA: Diagnosis not present

## 2013-10-22 DIAGNOSIS — E039 Hypothyroidism, unspecified: Secondary | ICD-10-CM | POA: Diagnosis present

## 2013-10-22 DIAGNOSIS — A498 Other bacterial infections of unspecified site: Secondary | ICD-10-CM | POA: Diagnosis present

## 2013-10-22 DIAGNOSIS — Z66 Do not resuscitate: Secondary | ICD-10-CM | POA: Diagnosis present

## 2013-10-22 DIAGNOSIS — R0602 Shortness of breath: Secondary | ICD-10-CM | POA: Diagnosis present

## 2013-10-22 DIAGNOSIS — R5381 Other malaise: Secondary | ICD-10-CM | POA: Diagnosis present

## 2013-10-22 DIAGNOSIS — E119 Type 2 diabetes mellitus without complications: Secondary | ICD-10-CM | POA: Diagnosis present

## 2013-10-22 DIAGNOSIS — I214 Non-ST elevation (NSTEMI) myocardial infarction: Secondary | ICD-10-CM | POA: Diagnosis present

## 2013-10-22 DIAGNOSIS — E871 Hypo-osmolality and hyponatremia: Secondary | ICD-10-CM

## 2013-10-22 DIAGNOSIS — I2510 Atherosclerotic heart disease of native coronary artery without angina pectoris: Secondary | ICD-10-CM

## 2013-10-22 DIAGNOSIS — M109 Gout, unspecified: Secondary | ICD-10-CM | POA: Diagnosis present

## 2013-10-22 DIAGNOSIS — N3000 Acute cystitis without hematuria: Secondary | ICD-10-CM

## 2013-10-22 DIAGNOSIS — I509 Heart failure, unspecified: Secondary | ICD-10-CM

## 2013-10-22 DIAGNOSIS — F411 Generalized anxiety disorder: Secondary | ICD-10-CM | POA: Diagnosis present

## 2013-10-22 DIAGNOSIS — N390 Urinary tract infection, site not specified: Secondary | ICD-10-CM

## 2013-10-22 DIAGNOSIS — I10 Essential (primary) hypertension: Secondary | ICD-10-CM

## 2013-10-22 DIAGNOSIS — I5031 Acute diastolic (congestive) heart failure: Principal | ICD-10-CM

## 2013-10-22 DIAGNOSIS — N289 Disorder of kidney and ureter, unspecified: Secondary | ICD-10-CM | POA: Diagnosis present

## 2013-10-22 HISTORY — DX: Type 2 diabetes mellitus without complications: E11.9

## 2013-10-22 HISTORY — DX: Essential (primary) hypertension: I10

## 2013-10-22 LAB — CBC
HEMATOCRIT: 31.1 % — AB (ref 36.0–46.0)
Hemoglobin: 10.6 g/dL — ABNORMAL LOW (ref 12.0–15.0)
MCH: 32.7 pg (ref 26.0–34.0)
MCHC: 34.1 g/dL (ref 30.0–36.0)
MCV: 96 fL (ref 78.0–100.0)
PLATELETS: 259 10*3/uL (ref 150–400)
RBC: 3.24 MIL/uL — AB (ref 3.87–5.11)
RDW: 14.3 % (ref 11.5–15.5)
WBC: 7.2 10*3/uL (ref 4.0–10.5)

## 2013-10-22 LAB — COMPREHENSIVE METABOLIC PANEL
ALK PHOS: 98 U/L (ref 39–117)
ALT: 12 U/L (ref 0–35)
AST: 23 U/L (ref 0–37)
Albumin: 2.9 g/dL — ABNORMAL LOW (ref 3.5–5.2)
Anion gap: 15 (ref 5–15)
BILIRUBIN TOTAL: 0.3 mg/dL (ref 0.3–1.2)
BUN: 28 mg/dL — ABNORMAL HIGH (ref 6–23)
CHLORIDE: 101 meq/L (ref 96–112)
CO2: 20 meq/L (ref 19–32)
Calcium: 10 mg/dL (ref 8.4–10.5)
Creatinine, Ser: 1.25 mg/dL — ABNORMAL HIGH (ref 0.50–1.10)
GFR calc Af Amer: 42 mL/min — ABNORMAL LOW (ref >90)
GFR calc non Af Amer: 36 mL/min — ABNORMAL LOW (ref >90)
Glucose, Bld: 162 mg/dL — ABNORMAL HIGH (ref 70–99)
Potassium: 4.6 mEq/L (ref 3.7–5.3)
Sodium: 136 mEq/L — ABNORMAL LOW (ref 137–147)
Total Protein: 6.2 g/dL (ref 6.0–8.3)

## 2013-10-22 LAB — URINALYSIS, ROUTINE W REFLEX MICROSCOPIC
Bilirubin Urine: NEGATIVE
Glucose, UA: NEGATIVE mg/dL
KETONES UR: NEGATIVE mg/dL
NITRITE: NEGATIVE
Protein, ur: NEGATIVE mg/dL
Specific Gravity, Urine: 1.008 (ref 1.005–1.030)
Urobilinogen, UA: 0.2 mg/dL (ref 0.0–1.0)
pH: 5 (ref 5.0–8.0)

## 2013-10-22 LAB — GLUCOSE, CAPILLARY: Glucose-Capillary: 148 mg/dL — ABNORMAL HIGH (ref 70–99)

## 2013-10-22 LAB — TROPONIN I: Troponin I: 0.93 ng/mL (ref <0.30)

## 2013-10-22 LAB — PRO B NATRIURETIC PEPTIDE: PRO B NATRI PEPTIDE: 11173 pg/mL — AB (ref 0–450)

## 2013-10-22 LAB — URINE MICROSCOPIC-ADD ON

## 2013-10-22 LAB — MRSA PCR SCREENING: MRSA by PCR: NEGATIVE

## 2013-10-22 MED ORDER — IPRATROPIUM-ALBUTEROL 0.5-2.5 (3) MG/3ML IN SOLN
3.0000 mL | Freq: Four times a day (QID) | RESPIRATORY_TRACT | Status: DC
  Administered 2013-10-22 – 2013-10-23 (×2): 3 mL via RESPIRATORY_TRACT
  Filled 2013-10-22 (×4): qty 3

## 2013-10-22 MED ORDER — FUROSEMIDE 10 MG/ML IJ SOLN
40.0000 mg | Freq: Once | INTRAMUSCULAR | Status: AC
  Administered 2013-10-22: 40 mg via INTRAVENOUS

## 2013-10-22 MED ORDER — SODIUM CHLORIDE 0.9 % IV SOLN: 250.0000 mL | INTRAVENOUS | Status: DC | PRN

## 2013-10-22 MED ORDER — NITROGLYCERIN 0.4 MG SL SUBL: 0.4000 mg | SUBLINGUAL_TABLET | SUBLINGUAL | Status: DC | PRN

## 2013-10-22 MED ORDER — SODIUM CHLORIDE 0.9 % IJ SOLN: 3.0000 mL | INTRAMUSCULAR | Status: DC | PRN

## 2013-10-22 MED ORDER — ONDANSETRON HCL 4 MG/2ML IJ SOLN: 4.0000 mg | Freq: Four times a day (QID) | INTRAMUSCULAR | Status: DC | PRN

## 2013-10-22 MED ORDER — METOPROLOL TARTRATE 12.5 MG HALF TABLET
12.5000 mg | ORAL_TABLET | Freq: Two times a day (BID) | ORAL | Status: DC
  Administered 2013-10-22 – 2013-10-23 (×3): 12.5 mg via ORAL
  Filled 2013-10-22 (×5): qty 1

## 2013-10-22 MED ORDER — FUROSEMIDE 10 MG/ML IJ SOLN
40.0000 mg | Freq: Two times a day (BID) | INTRAMUSCULAR | Status: DC
  Administered 2013-10-22 – 2013-10-28 (×12): 40 mg via INTRAVENOUS
  Filled 2013-10-22 (×17): qty 4

## 2013-10-22 MED ORDER — MORPHINE SULFATE 2 MG/ML IJ SOLN
1.0000 mg | INTRAMUSCULAR | Status: DC | PRN
  Administered 2013-10-22 – 2013-10-26 (×3): 1 mg via INTRAVENOUS
  Filled 2013-10-22 (×2): qty 1

## 2013-10-22 MED ORDER — OXYCODONE HCL 5 MG PO TABS
5.0000 mg | ORAL_TABLET | Freq: Four times a day (QID) | ORAL | Status: DC | PRN
  Administered 2013-10-22 – 2013-10-30 (×7): 5 mg via ORAL
  Filled 2013-10-22 (×7): qty 1

## 2013-10-22 MED ORDER — INSULIN ASPART 100 UNIT/ML ~~LOC~~ SOLN
0.0000 [IU] | Freq: Three times a day (TID) | SUBCUTANEOUS | Status: DC
  Administered 2013-10-23 (×2): 1 [IU] via SUBCUTANEOUS
  Administered 2013-10-23: 2 [IU] via SUBCUTANEOUS
  Administered 2013-10-24 (×2): 1 [IU] via SUBCUTANEOUS
  Administered 2013-10-24: 2 [IU] via SUBCUTANEOUS
  Administered 2013-10-25: 3 [IU] via SUBCUTANEOUS
  Administered 2013-10-25: 2 [IU] via SUBCUTANEOUS
  Administered 2013-10-26: 1 [IU] via SUBCUTANEOUS
  Administered 2013-10-26: 3 [IU] via SUBCUTANEOUS
  Administered 2013-10-26: 1 [IU] via SUBCUTANEOUS
  Administered 2013-10-27 – 2013-10-28 (×4): 2 [IU] via SUBCUTANEOUS
  Administered 2013-10-28 (×2): 1 [IU] via SUBCUTANEOUS
  Administered 2013-10-29 (×3): 2 [IU] via SUBCUTANEOUS
  Administered 2013-10-30: 1 [IU] via SUBCUTANEOUS
  Administered 2013-10-30: 3 [IU] via SUBCUTANEOUS

## 2013-10-22 MED ORDER — ALBUTEROL SULFATE (2.5 MG/3ML) 0.083% IN NEBU
2.5000 mg | INHALATION_SOLUTION | RESPIRATORY_TRACT | Status: DC | PRN
  Administered 2013-10-22 – 2013-10-23 (×2): 2.5 mg via RESPIRATORY_TRACT
  Filled 2013-10-22 (×2): qty 3

## 2013-10-22 MED ORDER — SODIUM CHLORIDE 0.9 % IJ SOLN
3.0000 mL | Freq: Two times a day (BID) | INTRAMUSCULAR | Status: DC
Start: 2013-10-22 — End: 2013-10-23
  Administered 2013-10-22 – 2013-10-23 (×2): 3 mL via INTRAVENOUS

## 2013-10-22 MED ORDER — ASPIRIN EC 325 MG PO TBEC
325.0000 mg | DELAYED_RELEASE_TABLET | Freq: Every day | ORAL | Status: DC
  Administered 2013-10-22 – 2013-10-30 (×9): 325 mg via ORAL
  Filled 2013-10-22 (×9): qty 1

## 2013-10-22 MED ORDER — POTASSIUM CHLORIDE CRYS ER 20 MEQ PO TBCR
20.0000 meq | EXTENDED_RELEASE_TABLET | Freq: Two times a day (BID) | ORAL | Status: DC
  Administered 2013-10-22 – 2013-10-23 (×3): 20 meq via ORAL
  Filled 2013-10-22 (×5): qty 1

## 2013-10-22 MED ORDER — ATORVASTATIN CALCIUM 20 MG PO TABS
20.0000 mg | ORAL_TABLET | Freq: Every day | ORAL | Status: DC
  Administered 2013-10-23 – 2013-10-28 (×6): 20 mg via ORAL
  Filled 2013-10-22 (×8): qty 1

## 2013-10-22 MED ORDER — LEVOTHYROXINE SODIUM 50 MCG PO TABS
50.0000 ug | ORAL_TABLET | Freq: Every day | ORAL | Status: DC
  Administered 2013-10-23 – 2013-10-30 (×8): 50 ug via ORAL
  Filled 2013-10-22 (×9): qty 1

## 2013-10-22 MED ORDER — ACETAMINOPHEN 325 MG PO TABS
650.0000 mg | ORAL_TABLET | ORAL | Status: DC | PRN
  Administered 2013-10-25 – 2013-10-30 (×4): 650 mg via ORAL
  Filled 2013-10-22 (×4): qty 2

## 2013-10-22 MED ORDER — HEPARIN SODIUM (PORCINE) 5000 UNIT/ML IJ SOLN
5000.0000 [IU] | Freq: Three times a day (TID) | INTRAMUSCULAR | Status: DC
  Administered 2013-10-22 – 2013-10-30 (×24): 5000 [IU] via SUBCUTANEOUS
  Filled 2013-10-22 (×26): qty 1

## 2013-10-22 NOTE — H&P (Addendum)
PATIENT DETAILS Name: Jodi Ferguson Age: 80 y.o. Sex: female Date of Birth: 02-Nov-1920 Admit Date: 10/22/2013 PCP:No PCP Per Patient  CHIEF COMPLAINT:  Shortness of breath  HPI: Jodi Ferguson is a 80 y.o. female with a Past Medical History of diabetes, hypertension, hypothyroidism, recent hospitalization in [facility] last week for what seems like acute diastolic failure and non-STEMI, managed medically and discharged home, was sent to [facility] emergency department by her primary care practitioner after a hospital followup visit. Apparently since her discharge, patient has not been doing well, has developed worsening shortness of breath and worsening lower extremity edema. She has gotten extremely weak as well. She was evaluated at [facility] emergency department, where she was also complaining of left shoulder and left arm pain, but was not having any chest pain. She was found to have bilateral pleural effusions and other findings suggestive of congestive heart failure and a chest x-ray, she was found to have mild creatinine elevation, and mild hyponatremia. Troponin at [facility] was 0.83, apparently last week it was 2.54. Patient's family requested transfer to [facility] for further evaluation and treatment.  Patient claims that for the past few days, she has had worsening orthopnea, and worsening dyspnea that is mostly worse on exertion. He claims that she slept on a recliner last night.  Apparently, last week prior to admission at [facility], patient developed similar symptoms with worsening shortness of breath, leg edema and was admitted for 4 days at [facility]. She was found to have acute heart failure along with non-STEMI and managed medically.  Per patient and family, prior to this past week she was never noted to have cardiac issues. She also does not have a history of CVA, Pul embolism.  ALLERGIES: None  PAST  MEDICAL HISTORY: Diabetes type 2 Hypertension Hypothyroidism Coronary artery disease  PAST SURGICAL HISTORY: No past surgical history on file.  MEDICATIONS AT HOME: Prior to Admission medications   Not on File    FAMILY HISTORY: No family history of CAD  SOCIAL HISTORY:  has no tobacco, alcohol, and drug history on file.  REVIEW OF SYSTEMS:  Constitutional:   No  weight loss, night sweats,  Fevers.  HEENT:    No headaches, Difficulty swallowing,Tooth/dental problems,Sore throat,   Cardio-vascular: No chest pain, dizziness, palpitations  GI:  No heartburn, indigestion, abdominal pain, nausea, vomiting, diarrhea, change in  bowel habits, loss of appetite  Resp:   No excess mucus, no productive cough, No non-productive cough,  No coughing up of blood.No change in color of mucus.No wheezing.No chest wall deformity  Skin:  no rash or lesions.  GU:  no dysuria, change in color of urine, no urgency or frequency.  No flank pain.  Musculoskeletal: No joint pain or swelling.  No decreased range of motion.  No back pain.  Psych: No change in mood or affect. No depression or anxiety.  No memory loss.   PHYSICAL EXAM: Blood pressure 136/49, temperature 98.4 F (36.9 C), temperature source Oral, height  (1.448 m), weight 72.1 kg (158 lb 15.2 oz).  General appearance :Awake, alert, not in any distress. Speech Clear. Looks very frail with generalized weakness. HEENT: Atraumatic and Normocephalic, pupils equally reactive to light and accomodation Neck: supple, no JVD. No cervical lymphadenopathy.  Chest:Good air entry bilaterally, decreased and she had both bases, with rales heard up to midlung. CVS: S1 S2 regular.  Abdomen: Bowel sounds present, Non tender and not  distended with no gaurding, rigidity or rebound. Extremities: B/L Lower Ext shows 2+ edema, both legs are warm to touch Neurology: Awake alert, and oriented X 3, CN II-XII intact, Non focal- but with  significant generalized weakness Skin:No Rash Wounds:N/A  LABS ON ADMISSION:  No results found for this basename: NA, K, CL, CO2, GLUCOSE, BUN, CREATININE, CALCIUM, MG, PHOS,  in the last 72 hours No results found for this basename: AST, ALT, ALKPHOS, BILITOT, PROT, ALBUMIN,  in the last 72 hours No results found for this basename: LIPASE, AMYLASE,  in the last 72 hours No results found for this basename: WBC, NEUTROABS, HGB, HCT, MCV, PLT,  in the last 72 hours No results found for this basename: CKTOTAL, CKMB, CKMBINDEX, TROPONINI,  in the last 72 hours No results found for this basename: DDIMER,  in the last 72 hours No components found with this basename: POCBNP,    RADIOLOGIC STUDIES ON ADMISSION: No results found.   EKG: pending  ASSESSMENT AND PLAN: Present on Admission:  .Suspected Acute diastolic CHF (congestive heart failure) - Will admit, start IV Lasix 40 mg twice a day. Decrease Lopressor to 12.5 mg twice a day. Obtain 2-D echocardiogram. Daily weights, strict intake and output. Follow clinical course.   Marland Kitchen CAD in native artery with recent NSTEMI - Reviewed prior history in paper chart from [facility], apparently patient had a non-STEMI last week and was managed medically. Currently without chest pain, however does complain of left shoulder area pain-but apparently patient has a history of arthritis in her neck area as well. Will check EKG, cycle cardiac enzymes, place on aspirin, statins and beta blockers. Would obtain a 2-D echocardiogram. After discussion with family and patient, it has been decided that we will manage this medically and not pursue invasive investigations like cardiac catheterization. Please note, last week while hospitalized at [facility] she had a peak troponin of 2.54, today while at [facility] emergency department it had decreased to 0.83.  Marland Kitchen HTN (hypertension) - Will be managed with Lasix and low-dose beta blocker for now.  Other agents will be added depending on her response to diuretics   . DM (diabetes mellitus), type 2 - Place on sliding scale insulin, follow CBG trend   . Unspecified hypothyroidism - Continue with levothyroxine   . Hyponatremia - Sodium 129 while at [facility], suspect this is secondary to congestive heart failure. Fluid restrict, start Lasix and follow clinical course.  . Mild renal insufficiency - Not sure if this is acute or chronic, will monitor closely while on diuresis. Check UA. Follow.  . Deconditioning - Suspect a major component of patient's presenting problems as secondary to advancing age, frailty and deconditioning after recent acute illness. PT evaluation will be ordered. Family not interested in pursuing SNF on discharge, they would like to go home with maximum home health services on discharge.  Further plan will depend as patient's clinical course evolves and further radiologic and laboratory data become available. Patient will be monitored closely.  Above noted plan was discussed with patient/family, they were in agreement.   DVT Prophylaxis: Prophylactic Heparin  Code Status: DNR- reconfirmed with patient and multiple family members at bedside  Total time spent for admission equals 45 minutes.  [facility] Pager [phone]  If 7PM-7AM, please contact night-coverage [email or web address] Password [email or web address] 10/22/2013, 5:54 PM  **Disclaimer: This note may have been dictated with voice recognition software. Similar sounding words can inadvertently be transcribed and this note may contain transcription  errors which may not have been corrected upon publication of note.**

## 2013-10-23 ENCOUNTER — Inpatient Hospital Stay (HOSPITAL_COMMUNITY): Payer: Medicare Other

## 2013-10-23 DIAGNOSIS — N390 Urinary tract infection, site not specified: Secondary | ICD-10-CM | POA: Diagnosis present

## 2013-10-23 DIAGNOSIS — I517 Cardiomegaly: Secondary | ICD-10-CM

## 2013-10-23 LAB — CBC
HCT: 31.4 % — ABNORMAL LOW (ref 36.0–46.0)
Hemoglobin: 10.6 g/dL — ABNORMAL LOW (ref 12.0–15.0)
MCH: 33.5 pg (ref 26.0–34.0)
MCHC: 33.8 g/dL (ref 30.0–36.0)
MCV: 99.4 fL (ref 78.0–100.0)
PLATELETS: 239 10*3/uL (ref 150–400)
RBC: 3.16 MIL/uL — AB (ref 3.87–5.11)
RDW: 14.6 % (ref 11.5–15.5)
WBC: 7.1 10*3/uL (ref 4.0–10.5)

## 2013-10-23 LAB — BASIC METABOLIC PANEL
Anion gap: 13 (ref 5–15)
BUN: 27 mg/dL — ABNORMAL HIGH (ref 6–23)
CO2: 24 mEq/L (ref 19–32)
Calcium: 9.6 mg/dL (ref 8.4–10.5)
Chloride: 102 mEq/L (ref 96–112)
Creatinine, Ser: 1.3 mg/dL — ABNORMAL HIGH (ref 0.50–1.10)
GFR, EST AFRICAN AMERICAN: 40 mL/min — AB (ref >90)
GFR, EST NON AFRICAN AMERICAN: 35 mL/min — AB (ref >90)
GLUCOSE: 142 mg/dL — AB (ref 70–99)
POTASSIUM: 4.9 meq/L (ref 3.7–5.3)
SODIUM: 139 meq/L (ref 137–147)

## 2013-10-23 LAB — GLUCOSE, CAPILLARY
GLUCOSE-CAPILLARY: 153 mg/dL — AB (ref 70–99)
Glucose-Capillary: 140 mg/dL — ABNORMAL HIGH (ref 70–99)
Glucose-Capillary: 154 mg/dL — ABNORMAL HIGH (ref 70–99)
Glucose-Capillary: 131 mg/dL — ABNORMAL HIGH (ref 70–99)

## 2013-10-23 LAB — TROPONIN I
Troponin I: 1.12 ng/mL (ref <0.30)
Troponin I: 1.6 ng/mL (ref <0.30)

## 2013-10-23 MED ORDER — LEVALBUTEROL HCL 0.63 MG/3ML IN NEBU: 0.6300 mg | INHALATION_SOLUTION | RESPIRATORY_TRACT | Status: DC | PRN

## 2013-10-23 MED ORDER — NITROGLYCERIN 2 % TD OINT
0.5000 [in_us] | TOPICAL_OINTMENT | Freq: Four times a day (QID) | TRANSDERMAL | Status: DC
  Administered 2013-10-23 – 2013-10-30 (×25): 0.5 [in_us] via TOPICAL
  Filled 2013-10-23: qty 30

## 2013-10-23 MED ORDER — ALPRAZOLAM 0.25 MG PO TABS
0.2500 mg | ORAL_TABLET | Freq: Three times a day (TID) | ORAL | Status: DC | PRN
  Administered 2013-10-23 – 2013-10-28 (×3): 0.25 mg via ORAL
  Filled 2013-10-23 (×3): qty 1

## 2013-10-23 MED ORDER — CETYLPYRIDINIUM CHLORIDE 0.05 % MT LIQD
7.0000 mL | Freq: Two times a day (BID) | OROMUCOSAL | Status: DC
  Administered 2013-10-23 – 2013-10-30 (×15): 7 mL via OROMUCOSAL

## 2013-10-23 MED ORDER — CALCIUM CARBONATE ANTACID 500 MG PO CHEW
1.0000 | CHEWABLE_TABLET | Freq: Two times a day (BID) | ORAL | Status: DC
  Administered 2013-10-23 – 2013-10-30 (×14): 200 mg via ORAL
  Filled 2013-10-23 (×15): qty 1

## 2013-10-23 MED ORDER — BUDESONIDE-FORMOTEROL FUMARATE 160-4.5 MCG/ACT IN AERO
2.0000 | INHALATION_SPRAY | Freq: Two times a day (BID) | RESPIRATORY_TRACT | Status: DC
  Administered 2013-10-24 – 2013-10-30 (×13): 2 via RESPIRATORY_TRACT
  Filled 2013-10-23 (×2): qty 6

## 2013-10-23 MED ORDER — DEXTROSE 5 % IV SOLN
1.0000 g | INTRAVENOUS | Status: DC
  Administered 2013-10-23 – 2013-10-26 (×4): 1 g via INTRAVENOUS
  Filled 2013-10-23 (×4): qty 10

## 2013-10-23 MED ORDER — CALCIUM CARBONATE 600 MG PO TABS: 600.0000 mg | ORAL_TABLET | Freq: Two times a day (BID) | ORAL | Status: DC

## 2013-10-23 NOTE — Progress Notes (Signed)
PT Cancellation Note  Patient Details Name: Jodi Ferguson MRN: [id number] DOB: 04-Dec-1920   Cancelled Treatment:    Reason Eval/Treat Not Completed: Medical issues which prohibited therapy. Pt with elevated troponin at 1.60 and trending up from yesterday. PT will hold and continue to check back for eval when medically able.   Ruthann Cancer 10/23/2013, 8:54 AM  Ruthann Cancer, PT, DPT Acute Rehabilitation Services Pager: [phone]

## 2013-10-23 NOTE — Progress Notes (Signed)
[name]  Jodi Ferguson WJX:[id number] DOB: Oct 11, 1920 DOA: 10/22/2013 PCP: No PCP Per Patient   Brief narrative: 28 with a past medical history diabetes, hypertension, hypothyroidism. Recently hospitalized at [facility] for apparent acute diastolic heart failure with associated non-STEMI. Troponin at that time peaked at 2.54. She was medically managed and discharged home. Subsequently she follow up with her primary care practitioner for routine post hospital visit and was sent back to the hospital because of progressive shortness of breath and increasing lower extremity edema with associated weakness. Family requested the patient transferred to [facility].  Upon arrival to [facility] she was noted with shortness of breath and also complaining of a shoulder and left arm pain which she described as was typical for when she had her "heart attack" appear her chest x-ray revealed bilateral pleural effusions and other interstitial markings consistent with congestive heart failure. In addition she had mild elevation in creatinine with associated hyponatremia. A troponin level was checked at [facility] prior to transfer and was 0.83.  Since admission patient has continued with the left arm and shoulder pain without any specific anterior chest discomfort. She is also continued with shortness of breath and is having some anxiety because of the ongoing respiratory symptoms. Her troponin has slowly climbed and as of 9/2 has increased to 1.6. Her urinalysis was also abnormal and was concerning for urinary tract infection prompting initiation of empiric antibiotics on 9/2. Her BUN and creatinine have actually risen somewhat since admission. She has had a moderate response to attempts to diurese. Because of ongoing respiratory symptoms of presumed ischemic etiology nitroglycerin paste was added on 9/2.  HPI/Subjective: Continues to endorse  left shoulder and arm discomfort, shortness of breath. RN also reports anxiety related to shortness of breath  Assessment/Plan:    Decompensated heart failure-presumed ischemic etiology -Seems precipitated due to ongoing coronary ischemia-treat ischemic symptoms medically and diuresis as blood pressure and renal function tolerate-2-D echocardiogram this admission with preserved left ventricular function without regional wall motion abnormalities and mild to moderate pulmonary hypertension-no LVH so not compatible with diastolic dysfunction    NSTEMI -Troponin trending upward in setting of what appears to be ongoing ischemic symptoms-patient and family have already determined to focus on medical management without any aggressive workup including cardiac catheterization-add Nitro paste-continue aspirin, statin and low-dose beta blocker    HTN  -BP soft-continue above noted medication    DM (diabetes mellitus), type 2 -Current CBGs controlled-continue sliding scale insulin-on Glucotrol at home-consider check hemoglobin A1c    Hypothyroidism -Continue Synthroid    Hyponatremia -Resolved and primary etiology do to volume overload from acute heart failure    UTI  -Urinalysis abnormal and likely consistent with UTI although did not present with leukocytosis-continue empiric antibiotics-followup on urinary culture  DVT prophylaxis: Subcutaneous heparin Code Status: DO NOT RESUSCITATE Family Communication: Multiple family members at bedside Disposition Plan/Expected LOS: Step down  Consultants: None  Procedures: None  Cultures: 9/1 urine culture pending  Antibiotics: Rocephin 9/2 >>>  Objective: Blood pressure 121/42, pulse 51, temperature 97.4 F (36.3 C), temperature source Oral, resp. rate 20, height  (1.448 m), weight 154 lb 15.7 oz (70.3 kg), SpO2 98.00%.  Intake/Output Summary (Last 24 hours) at 10/23/13 1406 Last data filed at 10/23/13 0820  Gross per 24 hour    Intake    480 ml  Output   1400 ml  Net   -920 ml  Exam: Gen: No acute respiratory distress Chest:  bilateral crackles - diminished in bases, 3 L Cardiac: Regular rate and rhythm, S1-S2, no rubs murmurs or gallops, 2-3+ lower extremity peripheral edema, no JVD Abdomen: Soft nontender nondistended without obvious hepatosplenomegaly, no ascites Extremities: Symmetrical in appearance without cyanosis, clubbing or effusion  Scheduled Meds:  Scheduled Meds: . antiseptic oral rinse  7 mL Mouth Rinse BID  . aspirin EC  325 mg Oral Daily  . atorvastatin  20 mg Oral q1800  . cefTRIAXone (ROCEPHIN)  IV  1 g Intravenous Q24H  . furosemide  40 mg Intravenous Q12H  . heparin  5,000 Units Subcutaneous 3 times per day  . insulin aspart  0-9 Units Subcutaneous TID WC  . ipratropium-albuterol  3 mL Nebulization Q6H  . levothyroxine  50 mcg Oral QAC breakfast  . metoprolol tartrate  12.5 mg Oral Q12H  . nitroGLYCERIN  0.5 inch Topical 4 times per day  . potassium chloride  20 mEq Oral BID  . sodium chloride  3 mL Intravenous Q12H   Data Reviewed: Basic Metabolic Panel:  Recent Labs Lab 10/22/13 1800 10/23/13 0525  NA 136* 139  K 4.6 4.9  CL 101 102  CO2 20 24  GLUCOSE 162* 142*  BUN 28* 27*  CREATININE 1.25* 1.30*  CALCIUM 10.0 9.6   Liver Function Tests:  Recent Labs Lab 10/22/13 1800  AST 23  ALT 12  ALKPHOS 98  BILITOT 0.3  PROT 6.2  ALBUMIN 2.9*   CBC:  Recent Labs Lab 10/22/13 1800 10/23/13 0525  WBC 7.2 7.1  HGB 10.6* 10.6*  HCT 31.1* 31.4*  MCV 96.0 99.4  PLT 259 239   Cardiac Enzymes:  Recent Labs Lab 10/22/13 1736 10/22/13 2348 10/23/13 0525  TROPONINI 0.93* 1.12* 1.60*   BNP (last 3 results)  Recent Labs  10/22/13 1736  PROBNP 11173.0*   CBG:  Recent Labs Lab 10/22/13 2003 10/23/13 0743 10/23/13 1137  GLUCAP 148* 140* 154*    Recent Results (from the past 240 hour(s))  MRSA PCR SCREENING     Status: None   Collection Time     10/22/13  7:46 PM      Result Value Ref Range Status   MRSA by PCR NEGATIVE  NEGATIVE Final   Comment:            The GeneXpert MRSA Assay (FDA     approved for NASAL specimens     only), is one component of a     comprehensive MRSA colonization     surveillance program. It is not     intended to diagnose MRSA     infection nor to guide or     monitor treatment for     MRSA infections.     Studies:  Recent x-ray studies have been reviewed in detail by the Attending Physician  Time spent :  35 mins      Junious Silk, ANP [facility] Office  [phone] Pager [phone]   **If unable to reach the above provider after paging please contact the Flow Manager @ [phone]  On-Call/Text Page:      [email or web address]      password [email or web address]  If 7PM-7AM, please contact night-coverage [email or web address] Password [email or web address] 10/23/2013, 2:06 PM   LOS: 1 day   I have personally examined this patient and reviewed the entire database. I have reviewed the above note, made any necessary editorial changes, and agree with its content.  Lonia Blood, MD [facility]

## 2013-10-23 NOTE — Progress Notes (Signed)
[facility]  Patient Status: Active (receiving services up to time of hospitalization)  [facility] is providing the following services: RN, PT and HHA  If patient discharges after hours, please call [phone].   Jodi Ferguson 10/23/2013, 10:06 AM

## 2013-10-23 NOTE — Plan of Care (Signed)
Problem: Phase I Progression Outcomes Goal: Pain controlled with appropriate interventions Outcome: Progressing Pain controlled with Oxycodone  PO. Patient c/o arthritis pain in Left Arm.  Controlled well with PO meds.

## 2013-10-23 NOTE — Care Management Note (Addendum)
    Page 1 of 2   10/29/2013     1:50:14 PM CARE MANAGEMENT NOTE 10/29/2013  Patient:  Jodi Ferguson, Jodi Ferguson   Account Number:  [id number]  Date Initiated:  10/23/2013  Documentation initiated by:  Junius Creamer  Subjective/Objective Assessment:   adm w heart failure     Action/Plan:   lives w fam, has aid per chart   Anticipated DC Date:  10/30/2013   Anticipated DC Plan:  HOME W HOME HEALTH SERVICES      DC Planning Services  CM consult      [facility] Choice  Resumption Of Svcs/PTA Provider   Choice offered to / List presented to:          [facility] arranged  HH-1 RN  HH-2 PT  HH-4 NURSE'S AIDE      HH agency  [facility]   Status of service:   Medicare Important Message given?  YES (If response is "NO", the following Medicare IM given date fields will be blank) Date Medicare IM given:  10/25/2013 Medicare IM given by:  [facility] Date Additional Medicare IM given:  10/28/2013 Additional Medicare IM given by:  Junius Creamer  Discharge Disposition:  HOME [facility] SERVICES  Per UR Regulation:  Reviewed for med. necessity/level of care/duration of stay  If discussed at Long Length of Stay Meetings, dates discussed:   10/29/2013    Comments:  10/29/13 1000 Oletta Cohn, RN, BSN, [place] [phone] Additional Medicare IM given.  9/3  [phone] debbie dowell rn,bsn pt act w [facility] for rn-pt-bath aid. [facility] aware pt in hospital.  9/2 0826 debbie dowell rn,bsn will moniter for [facility] needs as pt progresses.

## 2013-10-23 NOTE — Progress Notes (Signed)
  Echocardiogram 2D Echocardiogram has been performed.  [place]ian Co 10/23/2013, 9:33 AM

## 2013-10-24 ENCOUNTER — Encounter (HOSPITAL_COMMUNITY): Payer: Self-pay

## 2013-10-24 ENCOUNTER — Inpatient Hospital Stay (HOSPITAL_COMMUNITY): Payer: Medicare Other

## 2013-10-24 DIAGNOSIS — E039 Hypothyroidism, unspecified: Secondary | ICD-10-CM

## 2013-10-24 DIAGNOSIS — E871 Hypo-osmolality and hyponatremia: Secondary | ICD-10-CM

## 2013-10-24 DIAGNOSIS — M7989 Other specified soft tissue disorders: Secondary | ICD-10-CM

## 2013-10-24 DIAGNOSIS — N390 Urinary tract infection, site not specified: Secondary | ICD-10-CM

## 2013-10-24 DIAGNOSIS — I10 Essential (primary) hypertension: Secondary | ICD-10-CM

## 2013-10-24 DIAGNOSIS — I214 Non-ST elevation (NSTEMI) myocardial infarction: Secondary | ICD-10-CM

## 2013-10-24 LAB — GLUCOSE, CAPILLARY
Glucose-Capillary: 127 mg/dL — ABNORMAL HIGH (ref 70–99)
Glucose-Capillary: 162 mg/dL — ABNORMAL HIGH (ref 70–99)
Glucose-Capillary: 129 mg/dL — ABNORMAL HIGH (ref 70–99)
Glucose-Capillary: 106 mg/dL — ABNORMAL HIGH (ref 70–99)

## 2013-10-24 LAB — BASIC METABOLIC PANEL
ANION GAP: 13 (ref 5–15)
ANION GAP: 13 (ref 5–15)
BUN: 30 mg/dL — ABNORMAL HIGH (ref 6–23)
BUN: 29 mg/dL — ABNORMAL HIGH (ref 6–23)
CALCIUM: 9.7 mg/dL (ref 8.4–10.5)
CO2: 22 mEq/L (ref 19–32)
CO2: 24 mEq/L (ref 19–32)
Calcium: 9.6 mg/dL (ref 8.4–10.5)
Chloride: 101 mEq/L (ref 96–112)
Chloride: 99 mEq/L (ref 96–112)
Creatinine, Ser: 1.3 mg/dL — ABNORMAL HIGH (ref 0.50–1.10)
Creatinine, Ser: 1.34 mg/dL — ABNORMAL HIGH (ref 0.50–1.10)
GFR calc Af Amer: 40 mL/min — ABNORMAL LOW (ref >90)
GFR, EST AFRICAN AMERICAN: 39 mL/min — AB (ref >90)
GFR, EST NON AFRICAN AMERICAN: 35 mL/min — AB (ref >90)
GFR, EST NON AFRICAN AMERICAN: 34 mL/min — AB (ref >90)
GLUCOSE: 115 mg/dL — AB (ref 70–99)
Glucose, Bld: 120 mg/dL — ABNORMAL HIGH (ref 70–99)
POTASSIUM: 5.7 meq/L — AB (ref 3.7–5.3)
POTASSIUM: 4.5 meq/L (ref 3.7–5.3)
SODIUM: 136 meq/L — AB (ref 137–147)
SODIUM: 136 meq/L — AB (ref 137–147)

## 2013-10-24 LAB — POTASSIUM
POTASSIUM: 4.4 meq/L (ref 3.7–5.3)
Potassium: 4.8 mEq/L (ref 3.7–5.3)

## 2013-10-24 LAB — MAGNESIUM: Magnesium: 1.9 mg/dL (ref 1.5–2.5)

## 2013-10-24 MED ORDER — FUROSEMIDE 10 MG/ML IJ SOLN
40.0000 mg | Freq: Once | INTRAMUSCULAR | Status: AC
  Administered 2013-10-24: 40 mg via INTRAVENOUS

## 2013-10-24 MED ORDER — WARFARIN SODIUM 2.5 MG PO TABS
2.5000 mg | ORAL_TABLET | Freq: Once | ORAL | Status: AC
  Administered 2013-10-24: 2.5 mg via ORAL
  Filled 2013-10-24: qty 1

## 2013-10-24 MED ORDER — MORPHINE SULFATE 2 MG/ML IJ SOLN
1.0000 mg | Freq: Once | INTRAMUSCULAR | Status: AC
  Administered 2013-10-24: 1 mg via INTRAVENOUS
  Filled 2013-10-24: qty 1

## 2013-10-24 MED ORDER — FUROSEMIDE 10 MG/ML IJ SOLN
40.0000 mg | Freq: Once | INTRAMUSCULAR | Status: AC
  Administered 2013-10-24: 40 mg via INTRAVENOUS
  Filled 2013-10-24: qty 4

## 2013-10-24 MED ORDER — WARFARIN - PHARMACIST DOSING INPATIENT: Freq: Every day | Status: DC

## 2013-10-24 MED ORDER — CARVEDILOL 3.125 MG PO TABS
3.1250 mg | ORAL_TABLET | Freq: Two times a day (BID) | ORAL | Status: DC
  Administered 2013-10-24 – 2013-10-30 (×12): 3.125 mg via ORAL
  Filled 2013-10-24 (×14): qty 1

## 2013-10-24 MED ORDER — CARVEDILOL 3.125 MG PO TABS: 3.1250 mg | ORAL_TABLET | Freq: Two times a day (BID) | ORAL | Status: DC

## 2013-10-24 NOTE — Progress Notes (Signed)
Patient has arrived to room 3E23. Patient alert and oriented and has no complaints at this time. Patient appears to be in no distress. Cardiac monitor placed on patient and CCMD notified of patients arrival.

## 2013-10-24 NOTE — Evaluation (Signed)
Physical Therapy Evaluation Patient Details Name: Jodi Ferguson MRN: [id number] DOB: 1920-10-08 Today's Date: 10/24/2013   History of Present Illness  Pt is a 80 y.o. female with a PMH of DM, HTN, hypothyroidism, recent hospitalization in [facility] last week for what seems like acute diastolic failure and non-STEMI, managed medically and discharged home. She was sent to [facility] ED by her PCP after a hospital followup visit. Apparently since her discharge, patient has not been doing well, has developed worsening SOB and worsening LEE. She has gotten extremely weak as well. She was evaluated at [facility] ED, where she was also complaining of left shoulder and left arm pain, but was not having any chest pain. She was found to have bilateral pleural effusions and other findings suggestive of congestive heart failure. Patient's family requested transfer to [facility] for further evaluation and treatment.  Clinical Impression  Pt admitted with the above. Pt currently with functional limitations due to the deficits listed below (see PT Problem List). At the time of PT eval pt was able to ambulat ~8 feet with RW and +2 assist for safety and occasional physical assist. Pt with increased pain in R foot with light touch or weight bearing which she states began "a day or two ago".  Pt will benefit from skilled PT to increase their independence and safety with mobility to allow discharge to the venue listed below. Noted family wish for pt to return home with 24 hour assistance from home health, however feel pt is appropriate for short term rehab at the SNF level to improve strength and tolerance for functional activity prior to return home. Discussed with pt and she is in agreement.       Follow Up Recommendations SNF;Supervision/Assistance - 24 hour    Equipment Recommendations  Rolling walker with 5" wheels;3in1 (PT)    Recommendations for Other Services        Precautions / Restrictions Precautions Precautions: Fall Restrictions Weight Bearing Restrictions: No      Mobility  Bed Mobility Overal bed mobility: Needs Assistance;+2 for physical assistance Bed Mobility: Supine to Sit;Sit to Supine     Supine to sit: Min assist;+2 for physical assistance Sit to supine: Min assist;+2 for physical assistance   General bed mobility comments: VC's for sequencing and technique. Assist for trunk elevation to full sitting position, as well as to scoot hips all the way around to the EOB.  Transfers Overall transfer level: Needs assistance Equipment used: Rolling walker (2 wheeled) Transfers: Sit to/from Stand Sit to Stand: Min assist;+2 physical assistance;+2 safety/equipment         General transfer comment: VC's for hand placement on seated surface for safety. Pt was able to power-up to full standing position with assist, however moving very slowly due to pain in B feet when they touch the floor. Pt very short (4'9") and has difficulty sitting back down on the bed far enough back for safety.   Ambulation/Gait Ambulation/Gait assistance: Min assist;+2 physical assistance;+2 safety/equipment Ambulation Distance (Feet): 8 Feet Assistive device: Rolling walker (2 wheeled) Gait Pattern/deviations: Step-to pattern;Decreased stride length;Trunk flexed;Narrow base of support Gait velocity: Decreased Gait velocity interpretation: Below normal speed for age/gender General Gait Details: VC's for sequencing and safety awareness. Pt required increased cueing to off weight R foot with UE's to be able to advance LLE forward. Pt fatigued quickly and returned to the bed after 4' walking.  Stairs            Wheelchair  Mobility    Modified Rankin (Stroke Patients Only)       Balance Overall balance assessment: Needs assistance Sitting-balance support: Feet supported;Bilateral upper extremity supported Sitting balance-Leahy Scale: Poor Sitting  balance - Comments: Pt requires UE support to maintain balance while sitting EOB.    Standing balance support: Bilateral upper extremity supported;During functional activity Standing balance-Leahy Scale: Poor Standing balance comment: Pt requires assist from therapist as well as UE support to maintain standing balance at this time.                              Pertinent Vitals/Pain Pain Assessment: Faces Faces Pain Scale: Hurts even more Pain Location: R foot>L foot during WB and ambulation    Home Living Family/patient expects to be discharged to:: Private residence Living Arrangements: Children Available Help at Discharge: Personal care attendant;Available 24 hours/day;Family Type of Home: House Home Access: Ramped entrance     Home Layout: One level Home Equipment: Walker - 2 wheels      Prior Function Level of Independence: Needs assistance   Gait / Transfers Assistance Needed: Uses walker all the time  ADL's / Homemaking Assistance Needed: Aides assisting        Hand Dominance   Dominant Hand: Right    Extremity/Trunk Assessment   Upper Extremity Assessment: Defer to OT evaluation           Lower Extremity Assessment: Generalized weakness;RLE deficits/detail;LLE deficits/detail RLE Deficits / Details: Pt reports increased pain in R foot vs. L foot with ambulation and weight bearing. States this began "a day or two ago".  Decreased active ROM especially into DF.  LLE Deficits / Details: Pt reports pain while weightbearing however R foot increased pain vs. L foot.  Cervical / Trunk Assessment: Kyphotic  Communication   Communication: No difficulties  Cognition Arousal/Alertness: Awake/alert Behavior During Therapy: Flat affect Overall Cognitive Status: Within Functional Limits for tasks assessed                      General Comments      Exercises        Assessment/Plan    PT Assessment Patient needs continued PT services   PT Diagnosis Difficulty walking;Generalized weakness   PT Problem List Decreased strength;Decreased range of motion;Decreased activity tolerance;Decreased balance;Decreased mobility;Decreased knowledge of use of DME;Decreased safety awareness;Decreased knowledge of precautions;Cardiopulmonary status limiting activity;Pain  PT Treatment Interventions DME instruction;Gait training;Stair training;Functional mobility training;Therapeutic activities;Therapeutic exercise;Neuromuscular re-education;Patient/family education   PT Goals (Current goals can be found in the Care Plan section) Acute Rehab PT Goals Patient Stated Goal: To get stronger PT Goal Formulation: With patient Time For Goal Achievement: 11/07/13 Potential to Achieve Goals: Good    Frequency Min 2X/week   Barriers to discharge        Co-evaluation               End of Session Equipment Utilized During Treatment: Gait belt;Oxygen Activity Tolerance: Patient limited by fatigue Patient left: in bed;with call bell/phone within reach Nurse Communication: Mobility status         Time: 1610-9604 PT Time Calculation (min): 19 min   Charges:   PT Evaluation $Initial PT Evaluation Tier I: 1 Procedure PT Treatments $Therapeutic Activity: 8-22 mins   PT G Codes:          Ruthann Cancer 10/24/2013, 10:45 AM  Ruthann Cancer, PT, DPT Acute Rehabilitation Services Pager: [phone]

## 2013-10-24 NOTE — Progress Notes (Signed)
Report received from [facility], [place] from [facility]. Will be expecting patients arrival to unit.

## 2013-10-24 NOTE — Progress Notes (Signed)
VASCULAR LAB PRELIMINARY  PRELIMINARY  PRELIMINARY  PRELIMINARY  Bilateral lower extremity venous Dopplers completed.    Preliminary report:  There is no DVT or SVT noted in the bilateral lower extremities.   Sharin Altidor, RVT 10/24/2013, 10:22 AM

## 2013-10-24 NOTE — Progress Notes (Signed)
[name]  Jodi Ferguson DOB: 22-Mar-1920 DOA: 10/22/2013 PCP: [name], MD   Brief narrative: 80 yo WF PMHx diabetes, hypertension, hypothyroidism. Recently hospitalized at [facility] for apparent acute diastolic heart failure with associated non-STEMI. Troponin at that time peaked at 2.54. She was medically managed and discharged home. Subsequently she follow up with her primary care practitioner for routine post hospital visit and was sent back to the hospital because of progressive shortness of breath and increasing lower extremity edema with associated weakness. Family requested the patient transferred to [facility].  Upon arrival to [facility] she was noted with shortness of breath and also complaining of a shoulder and left arm pain which she described as was typical for when she had her "heart attack" appear her chest x-ray revealed bilateral pleural effusions and other interstitial markings consistent with congestive heart failure. In addition she had mild elevation in creatinine with associated hyponatremia. A troponin level was checked at [facility] prior to transfer and was 0.83.  Since admission patient has continued with the left arm and shoulder pain without any specific anterior chest discomfort. She is also continued with shortness of breath and is having some anxiety because of the ongoing respiratory symptoms. Her troponin has slowly climbed and as of 9/2 has increased to 1.6. Her urinalysis was also abnormal and was concerning for urinary tract infection prompting initiation of empiric antibiotics on 9/2. Her BUN and creatinine have actually risen somewhat since admission. She has had a moderate response to attempts to diurese. Because of ongoing respiratory symptoms of presumed ischemic etiology nitroglycerin paste was added on 9/2.  HPI/Subjective: Endorsing nearly resolved left arm  and shoulder pain which is her ischemic equivalent. No further shortness of breath at rest  Assessment/Plan:    Decompensated heart failure-presumed ischemic etiology -Seems precipitated due to ongoing coronary ischemia-treating ischemic symptoms medically and diuresising as blood pressure and renal function tolerates-2-D echocardiogram this admission with preserved left ventricular function without regional wall motion abnormalities and mild to moderate pulmonary hypertension-no LVH so not compatible with diastolic dysfunction-weight has decreased 5 pounds since admission -9/3 -3.5 L -Patient acutely SOB, placed on continuous pulse ox -Stat PCXR -Morphine 1 mg IV x1 -Lasix 40 mg IV x1 now, will repeat Lasix 40 mg IV at 2359 -Stat BMP/magnesium now, obtain potassium at 2200    NSTEMI -Troponin trended upward in setting of what appeared to be ongoing ischemic symptoms-patient and family have already determined to focus on medical management without any aggressive workup including cardiac catheterization-added Nitro paste this admission so consider transitioning to Nitrol patch versus low-dose Imdur-continue aspirin, statin and low-dose beta blocker -DC metoprolol secondary to low BP (unable to effectively decrease dose); patient currently most likely having pulmonary edema. Start Coreg 3.125 mg BID  -See decompensated heart failure    HTN  -BP slowly improving-continue above noted medication    DM (diabetes mellitus), type 2 -Current CBGs controlled-continue sliding scale insulin-on Glucotrol at home-consider check hemoglobin A1c    Hypothyroidism -Continue Synthroid    Hyponatremia -Resolved and primary etiology do to volume overload from acute heart failure    Escherichia coli UTI  -Urinalysis abnormal and likely consistent with UTI although did not present with leukocytosis-continue empiric antibiotics-followup on urinary culture    DVT prophylaxis: Subcutaneous heparin Code  Status: DO NOT RESUSCITATE Family Communication: Multiple family members at bedside Disposition Plan/Expected LOS: Transfer to telemetry-appears well need  several days of IV diuresis before be appropriate for discharge home-once respiratory symptoms improve will need PT/OT to determine any home needs  Consultants: None  Procedures: None  Cultures: 9/1 urine culture > 100,000 colonies Escherichia coli  Antibiotics: Rocephin 9/2 >>>   Objective: Blood pressure 130/54, pulse 63, temperature 98.1 F (36.7 C), temperature source Oral, resp. rate 26, height  (1.448 m), weight 153 lb 10.6 oz (69.7 kg), SpO2 100.00%.  Intake/Output Summary (Last 24 hours) at 10/24/13 1431 Last data filed at 10/24/13 1340  Gross per 24 hour  Intake    173 ml  Output   1850 ml  Net  -1677 ml    Exam: Gen: Moderate Acute respiratory distress Chest:  bilateral crackles have improved but have not resolved - diminished in bases, 3 L Cardiac: Regular rate and rhythm, S1-S2, no rubs murmurs or gallops, 2+ lower extremity peripheral edema, no JVD Abdomen: Soft nontender nondistended without obvious hepatosplenomegaly, no ascites Extremities: Symmetrical in appearance without cyanosis, clubbing or effusion  Scheduled Meds:  Scheduled Meds: . antiseptic oral rinse  7 mL Mouth Rinse BID  . aspirin EC  325 mg Oral Daily  . atorvastatin  20 mg Oral q1800  . budesonide-formoterol  2 puff Inhalation BID  . calcium carbonate  1 tablet Oral BID  . cefTRIAXone (ROCEPHIN)  IV  1 g Intravenous Q24H  . furosemide  40 mg Intravenous Q12H  . heparin  5,000 Units Subcutaneous 3 times per day  . insulin aspart  0-9 Units Subcutaneous TID WC  . levothyroxine  50 mcg Oral QAC breakfast  . metoprolol tartrate  12.5 mg Oral Q12H  . nitroGLYCERIN  0.5 inch Topical 4 times per day  . warfarin  2.5 mg Oral ONCE-1800  . Warfarin - Pharmacist Dosing Inpatient   Does not apply q1800   Data Reviewed: Basic Metabolic  Panel:  Recent Labs Lab 10/22/13 1800 10/23/13 0525 10/24/13 0306 10/24/13 1110  NA 136* 139 136*  --   K 4.6 4.9 5.7* 4.8  CL 101 102 101  --   CO2 --   GLUCOSE 162* 142* 115*  --   BUN 28* 27* 30*  --   CREATININE 1.25* 1.30* 1.30*  --   CALCIUM 10.0 9.6 9.6  --    Liver Function Tests:  Recent Labs Lab 10/22/13 1800  AST 23  ALT 12  ALKPHOS 98  BILITOT 0.3  PROT 6.2  ALBUMIN 2.9*   CBC:  Recent Labs Lab 10/22/13 1800 10/23/13 0525  WBC 7.2 7.1  HGB 10.6* 10.6*  HCT 31.1* 31.4*  MCV 96.0 99.4  PLT 259 239   Cardiac Enzymes:  Recent Labs Lab 10/22/13 1736 10/22/13 2348 10/23/13 0525  TROPONINI 0.93* 1.12* 1.60*   BNP (last 3 results)  Recent Labs  10/22/13 1736  PROBNP 11173.0*   CBG:  Recent Labs Lab 10/23/13 1137 10/23/13 1630 10/23/13 2106 10/24/13 0754 10/24/13 1143  GLUCAP 154* 131* 153* 127* 162*    Recent Results (from the past 240 hour(s))  URINE CULTURE     Status: None   Collection Time    10/22/13  6:10 PM      Result Value Ref Range Status   Specimen Description URINE, CATHETERIZED   Final   Special Requests NONE   Final   Culture  Setup Time     Final   Value: 10/22/2013 19:01     Performed at [facility]  Count     Final   Value: >=100,000 COLONIES/ML     Performed at [facility]   Culture     Final   Value: ESCHERICHIA COLI     Performed at [facility]   Report Status PENDING   Incomplete  MRSA PCR SCREENING     Status: None   Collection Time    10/22/13  7:46 PM      Result Value Ref Range Status   MRSA by PCR NEGATIVE  NEGATIVE Final   Comment:            The GeneXpert MRSA Assay (FDA     approved for NASAL specimens     only), is one component of a     comprehensive MRSA colonization     surveillance program. It is not     intended to diagnose MRSA     infection nor to guide or     monitor treatment for     MRSA infections.     Studies:  Recent x-ray  studies have been reviewed in detail by the Attending Physician  Time spent :  35 mins      Jodi Ferguson, ANP [facility] Office  [phone] Pager [phone]   **If unable to reach the above provider after paging please contact the Flow Manager @ [phone]  On-Call/Text Page:      [email or web address]      password [email or web address]  If 7PM-7AM, please contact night-coverage [email or web address] Password [email or web address] 10/24/2013, 2:31 PM   LOS: 2 days  Examined patient and discussed assessment and plan with ANP Revonda Standard and agree with the above plan. Family at bedside discussed plan of care and its risks, (patient acutely short of breath) family and patient willing to accept risks. Patient with multiple complex medical diagnosis> 40 minutes spent in direct patient care

## 2013-10-24 NOTE — Progress Notes (Signed)
ANTICOAGULATION CONSULT NOTE - Initial Consult  Pharmacy Consult for Coumadin Indication: atrial fibrillation  Allergies  Allergen Reactions  . Benadryl [Diphenhydramine Hcl]   . Codeine Nausea Only  . Penicillins Rash  . Sulfa Antibiotics Rash    Patient Measurements: Height:  (144.8 cm) Weight: 152 lb 1.9 oz (69 kg) IBW/kg (Calculated) : 38.6  Vital Signs: Temp: 98.7 F (37.1 C) (09/03 0755) Temp src: Oral (09/03 0755) BP: 126/39 mmHg (09/03 0800) Pulse Rate: 66 (09/03 0800)  Labs:  Recent Labs  10/22/13 1736 10/22/13 1800 10/22/13 2348 10/23/13 0525 10/24/13 0306  HGB  --  10.6*  --  10.6*  --   HCT  --  31.1*  --  31.4*  --   PLT  --  259  --  239  --   CREATININE  --  1.25*  --  1.30* 1.30*  TROPONINI 0.93*  --  1.12* 1.60*  --     Estimated Creatinine Clearance: 22.6 ml/min (by C-G formula based on Cr of 1.3).   Medical History: Past Medical History  Diagnosis Date  . Diabetes   . HTN (hypertension)   . Hypothyroidism     Medications:  Scheduled:  . antiseptic oral rinse  7 mL Mouth Rinse BID  . aspirin EC  325 mg Oral Daily  . atorvastatin  20 mg Oral q1800  . budesonide-formoterol  2 puff Inhalation BID  . calcium carbonate  1 tablet Oral BID  . cefTRIAXone (ROCEPHIN)  IV  1 g Intravenous Q24H  . furosemide  40 mg Intravenous Q12H  . heparin  5,000 Units Subcutaneous 3 times per day  . insulin aspart  0-9 Units Subcutaneous TID WC  . levothyroxine  50 mcg Oral QAC breakfast  . metoprolol tartrate  12.5 mg Oral Q12H  . nitroGLYCERIN  0.5 inch Topical 4 times per day    Assessment: 80yo F w/ pmh of diabetes, HTN, and hypothyroidism. Recently hospitalized at [facility] for acute diastolic HF and NSTEMI. Patient now back with HF and associated SOB, LE edema, weakness, and left shoulder/arm pain. Recent diagnosis of A fib. Baseline INR 1.0 noted at [facility] on 10/22/13.  Goal of Therapy:  INR 2-3 Monitor platelets by anticoagulation  protocol: Yes   Plan:  Initiate warfarin 2.5 mg daily d/t patient's old age, low albumin (2.9), and HF.  Monitor INR daily starting 9/4.  Will provide education to patient and/or caregiver.   Omar Person, PharmD Candidate 10/24/2013,9:25 AM  I have reviewed the above and agree with the plan.  Harland German, Pharm D 10/24/2013 12:23 PM

## 2013-10-25 ENCOUNTER — Inpatient Hospital Stay (HOSPITAL_COMMUNITY): Payer: Medicare Other

## 2013-10-25 LAB — PROTIME-INR
INR: 1.12 (ref 0.00–1.49)
PROTHROMBIN TIME: 14.4 s (ref 11.6–15.2)

## 2013-10-25 LAB — BASIC METABOLIC PANEL
Anion gap: 15 (ref 5–15)
BUN: 29 mg/dL — ABNORMAL HIGH (ref 6–23)
CHLORIDE: 98 meq/L (ref 96–112)
CO2: 26 meq/L (ref 19–32)
Calcium: 9.8 mg/dL (ref 8.4–10.5)
Creatinine, Ser: 1.31 mg/dL — ABNORMAL HIGH (ref 0.50–1.10)
GFR calc Af Amer: 40 mL/min — ABNORMAL LOW (ref >90)
GFR calc non Af Amer: 34 mL/min — ABNORMAL LOW (ref >90)
GLUCOSE: 108 mg/dL — AB (ref 70–99)
POTASSIUM: 4.2 meq/L (ref 3.7–5.3)
SODIUM: 139 meq/L (ref 137–147)

## 2013-10-25 LAB — URINE CULTURE: Colony Count: >=100000

## 2013-10-25 LAB — GLUCOSE, CAPILLARY
GLUCOSE-CAPILLARY: 131 mg/dL — AB (ref 70–99)
Glucose-Capillary: 118 mg/dL — ABNORMAL HIGH (ref 70–99)
Glucose-Capillary: 222 mg/dL — ABNORMAL HIGH (ref 70–99)
Glucose-Capillary: 163 mg/dL — ABNORMAL HIGH (ref 70–99)

## 2013-10-25 LAB — HEMOGLOBIN A1C
HEMOGLOBIN A1C: 7.4 % — AB (ref <5.7)
MEAN PLASMA GLUCOSE: 166 mg/dL — AB (ref <117)

## 2013-10-25 MED ORDER — GLUCERNA PO LIQD: 237.0000 mL | Freq: Every day | ORAL | Status: DC

## 2013-10-25 MED ORDER — GLUCERNA SHAKE PO LIQD
237.0000 mL | Freq: Every day | ORAL | Status: DC
Start: 2013-10-25 — End: 2013-10-30
  Administered 2013-10-25 – 2013-10-30 (×6): 237 mL via ORAL
  Filled 2013-10-25: qty 237

## 2013-10-25 NOTE — Progress Notes (Signed)
[facility] PROGRESS NOTE  Jodi Ferguson ZOX:[id number] DOB: 1920-04-15 DOA: 10/22/2013 PCP: [name], MD  Assessment/Plan:  Active Problems:    Acute diastolic CHF (congestive heart failure) - Currently on continuous pulse ox but recorded oxygen saturations > 98 percent - supplemental oxygen prn - Pt on Lasix - Carvedilol on board  CAD in native artery - Medical management per EMR, patient on aspirin and statin, reported no chest pain  HTN (hypertension) - stable pt on carvedilol  DM (diabetes mellitus), type 2 - Pt currently on SSI - Also on heart healthy diet/ diabetic diet - Glucerna recently added  Unspecified hypothyroidism - Stable on synthroid  Hyponatremia - resolved and most likely was due to patient being fluid overloaded.  UTI (urinary tract infection) - Pt is currently on Rocephin  Code Status: DNR Family Communication: None at bedside. Disposition Plan: Pending improvement in condition   Consultants:  None  Procedures:  None  Antibiotics: Rocephin  HPI/Subjective: Pt has no new complaints. No acute issues overnight.  At time still feels SOB but supplemental oxygen has helped her.  Objective: Filed Vitals:   10/25/13 0837  BP:   Pulse: 60  Temp:   Resp: 20    Intake/Output Summary (Last 24 hours) at 10/25/13 1449 Last data filed at 10/25/13 1441  Gross per 24 hour  Intake    480 ml  Output   2950 ml  Net  -2470 ml   Filed Weights   10/24/13 0300 10/24/13 1328 10/25/13 0549  Weight: 69 kg (152 lb 1.9 oz) 69.7 kg (153 lb 10.6 oz) 69 kg (152 lb 1.9 oz)    Exam:   General:  Pt in nad, alert and awake  Cardiovascular: S1 and S2 present, no rubs  Respiratory: [place] in place, oxygenating 98-100 on pulse ox at bedside, no wheezes, CTA BL, decreased breath sounds at bases  Abdomen: soft, NT, ND  Musculoskeletal: no cyanosis or clubbing   Data Reviewed: Basic Metabolic Panel:  Recent Labs Lab 10/22/13 1800  10/23/13 0525 10/24/13 0306 10/24/13 1110 10/24/13 2037 10/24/13 2232 10/25/13 0330  NA 136* 139 136*  --  136*  --  139  K 4.6 4.9 5.7* 4.8 4.5 4.4 4.2  CL 101 102 101  --  99  --  98  CO2 --  24  --  26  GLUCOSE 162* 142* 115*  --  120*  --  108*  BUN 28* 27* 30*  --  29*  --  29*  CREATININE 1.25* 1.30* 1.30*  --  1.34*  --  1.31*  CALCIUM 10.0 9.6 9.6  --  9.7  --  9.8  MG  --   --   --   --  1.9  --   --    Liver Function Tests:  Recent Labs Lab 10/22/13 1800  AST 23  ALT 12  ALKPHOS 98  BILITOT 0.3  PROT 6.2  ALBUMIN 2.9*   No results found for this basename: LIPASE, AMYLASE,  in the last 168 hours No results found for this basename: AMMONIA,  in the last 168 hours CBC:  Recent Labs Lab 10/22/13 1800 10/23/13 0525  WBC 7.2 7.1  HGB 10.6* 10.6*  HCT 31.1* 31.4*  MCV 96.0 99.4  PLT 259 239   Cardiac Enzymes:  Recent Labs Lab 10/22/13 1736 10/22/13 2348 10/23/13 0525  TROPONINI 0.93* 1.12* 1.60*   BNP (last 3 results)  Recent Labs  10/22/13 1736  PROBNP  11173.0*   CBG:  Recent Labs Lab 10/24/13 1143 10/24/13 1604 10/24/13 2109 10/25/13 0553 10/25/13 1125  GLUCAP 162* 129* 106* 118* 222*    Recent Results (from the past 240 hour(s))  URINE CULTURE     Status: None   Collection Time    10/22/13  6:10 PM      Result Value Ref Range Status   Specimen Description URINE, CATHETERIZED   Final   Special Requests NONE   Final   Culture  Setup Time     Final   Value: 10/22/2013 19:01     Performed at [facility] Count     Final   Value: >=100,000 COLONIES/ML     Performed at [facility]   Culture     Final   Value: ESCHERICHIA COLI     Performed at [facility]   Report Status 10/25/2013 FINAL   Final   Organism ID, Bacteria ESCHERICHIA COLI   Final  MRSA PCR SCREENING     Status: None   Collection Time    10/22/13  7:46 PM      Result Value Ref Range Status   MRSA by PCR NEGATIVE   NEGATIVE Final   Comment:            The GeneXpert MRSA Assay (FDA     approved for NASAL specimens     only), is one component of a     comprehensive MRSA colonization     surveillance program. It is not     intended to diagnose MRSA     infection nor to guide or     monitor treatment for     MRSA infections.     Studies: Dg Chest Port 1 View  10/25/2013   CLINICAL DATA:  Follow-up pulmonary edema and pleural effusions  EXAM: PORTABLE CHEST - 1 VIEW  COMPARISON:  Portable chest x-ray of October 24, 2013  FINDINGS: Again demonstrated are small bilateral pleural effusions layering inferiorly. The left hemidiaphragm is largely obscured but stable. The cardiac silhouette is top-normal in size. Mild central pulmonary vascular prominence is present and stable. The mediastinum is normal in width. There is gentle curvature of the thoracolumbar spine with convexity towards the left.  IMPRESSION: Stable small bilateral pleural effusions likely secondary to CHF. Stable left basilar atelectasis is suspected.   Electronically Signed   By: David  [place]   On: 10/25/2013 07:47   Dg Chest Port 1 View  10/24/2013   CLINICAL DATA:  Short of breath  EXAM: PORTABLE CHEST - 1 VIEW  COMPARISON:  0019 hr  FINDINGS: Upper normal heart size. Vascular congestion persists. Bilateral pleural effusions left greater than right persists. Basilar edema persists. Pleural fluid in the right minor fissure.  IMPRESSION: Stable volume overload.   Electronically Signed   By: Maryclare Bean M.D.   On: 10/24/2013 19:27    Scheduled Meds: . antiseptic oral rinse  7 mL Mouth Rinse BID  . aspirin EC  325 mg Oral Daily  . atorvastatin  20 mg Oral q1800  . budesonide-formoterol  2 puff Inhalation BID  . calcium carbonate  1 tablet Oral BID  . carvedilol  3.125 mg Oral BID WC  . cefTRIAXone (ROCEPHIN)  IV  1 g Intravenous Q24H  . feeding supplement (GLUCERNA SHAKE)  237 mL Oral Daily  . furosemide  40 mg Intravenous Q12H  . heparin   5,000 Units Subcutaneous 3 times per day  .  insulin aspart  0-9 Units Subcutaneous TID WC  . levothyroxine  50 mcg Oral QAC breakfast  . nitroGLYCERIN  0.5 inch Topical 4 times per day   Continuous Infusions:    Time spent: > 35 minutes    [name]  [facility] Pager [phone] If 7PM-7AM, please contact night-coverage at [email or web address], password [facility] 10/25/2013, 2:49 PM  LOS: 3 days

## 2013-10-25 NOTE — Plan of Care (Signed)
Problem: Phase I Progression Outcomes Goal: EF % per last Echo/documented,Core Reminder form on chart Outcome: Completed/Met Date Met:  10/25/13 Echo is 55-60%

## 2013-10-25 NOTE — Plan of Care (Signed)
Problem: Phase I Progression Outcomes Goal: Dyspnea controlled at rest (HF) Outcome: Not Progressing Patient states she feels like she cannot breathe.  She has had good output throughout the night and her continuous pulse ox. Has shown over 95% on 3-4L with no drops during rest.  Will continue to monitor for distress.

## 2013-10-26 LAB — BASIC METABOLIC PANEL
Anion gap: 15 (ref 5–15)
BUN: 31 mg/dL — AB (ref 6–23)
CO2: 29 mEq/L (ref 19–32)
Calcium: 9.6 mg/dL (ref 8.4–10.5)
Chloride: 97 mEq/L (ref 96–112)
Creatinine, Ser: 1.27 mg/dL — ABNORMAL HIGH (ref 0.50–1.10)
GFR calc Af Amer: 41 mL/min — ABNORMAL LOW (ref >90)
GFR, EST NON AFRICAN AMERICAN: 36 mL/min — AB (ref >90)
Glucose, Bld: 149 mg/dL — ABNORMAL HIGH (ref 70–99)
Potassium: 3.8 mEq/L (ref 3.7–5.3)
Sodium: 141 mEq/L (ref 137–147)

## 2013-10-26 LAB — PROTIME-INR
INR: 1.13 (ref 0.00–1.49)
Prothrombin Time: 14.5 seconds (ref 11.6–15.2)

## 2013-10-26 LAB — GLUCOSE, CAPILLARY
GLUCOSE-CAPILLARY: 127 mg/dL — AB (ref 70–99)
Glucose-Capillary: 211 mg/dL — ABNORMAL HIGH (ref 70–99)
Glucose-Capillary: 148 mg/dL — ABNORMAL HIGH (ref 70–99)
Glucose-Capillary: 174 mg/dL — ABNORMAL HIGH (ref 70–99)

## 2013-10-26 LAB — URIC ACID: Uric Acid, Serum: 14.5 mg/dL — ABNORMAL HIGH (ref 2.4–7.0)

## 2013-10-26 MED ORDER — CIPROFLOXACIN HCL 500 MG PO TABS
500.0000 mg | ORAL_TABLET | Freq: Every day | ORAL | Status: DC
  Administered 2013-10-27 – 2013-10-29 (×3): 500 mg via ORAL
  Filled 2013-10-26 (×4): qty 1

## 2013-10-26 MED ORDER — COLCHICINE 0.6 MG PO TABS
0.6000 mg | ORAL_TABLET | Freq: Every day | ORAL | Status: DC
  Administered 2013-10-26 – 2013-10-28 (×3): 0.6 mg via ORAL
  Filled 2013-10-26 (×4): qty 1

## 2013-10-26 MED ORDER — COLCHICINE 0.6 MG PO TABS
1.2000 mg | ORAL_TABLET | Freq: Once | ORAL | Status: AC
  Administered 2013-10-26: 1.2 mg via ORAL
  Filled 2013-10-26: qty 2

## 2013-10-26 NOTE — Progress Notes (Signed)
ANTIBIOTIC CONSULT NOTE - INITIAL  Pharmacy Consult for Cipro Indication: Ecoli UTI  Allergies  Allergen Reactions  . Benadryl [Diphenhydramine Hcl]   . Codeine Nausea Only  . Penicillins Rash  . Sulfa Antibiotics Rash    Patient Measurements: Height:  (144.8 cm) Weight: 152 lb 12.5 oz (69.3 kg) IBW/kg (Calculated) : 38.6 Adjusted Body Weight:   Vital Signs: Temp: 97.8 F (36.6 C) (09/05 0333) Temp src: Oral (09/05 0333) BP: 118/58 mmHg (09/05 0601) Pulse Rate: 64 (09/05 0601) Intake/Output from previous day: 09/04 0701 - 09/05 0700 In: 360 [P.O.:360] Out: 1790 [Urine:1790] Intake/Output from this shift:    Labs:  Recent Labs  10/24/13 2037 10/25/13 0330 10/26/13 0102  CREATININE 1.34* 1.31* 1.27*   Estimated Creatinine Clearance: 23.2 ml/min (by C-G formula based on Cr of 1.27). No results found for this basename: Rolm Gala, VANCORANDOM, GENTTROUGH, GENTPEAK, GENTRANDOM, TOBRATROUGH, TOBRAPEAK, TOBRARND, AMIKACINPEAK, AMIKACINTROU, AMIKACIN,  in the last 72 hours   Microbiology: Recent Results (from the past 720 hour(s))  URINE CULTURE     Status: None   Collection Time    10/22/13  6:10 PM      Result Value Ref Range Status   Specimen Description URINE, CATHETERIZED   Final   Special Requests NONE   Final   Culture  Setup Time     Final   Value: 10/22/2013 19:01     Performed at [facility] Count     Final   Value: >=100,000 COLONIES/ML     Performed at [facility]   Culture     Final   Value: ESCHERICHIA COLI     Performed at [facility]   Report Status 10/25/2013 FINAL   Final   Organism ID, Bacteria ESCHERICHIA COLI   Final  MRSA PCR SCREENING     Status: None   Collection Time    10/22/13  7:46 PM      Result Value Ref Range Status   MRSA by PCR NEGATIVE  NEGATIVE Final   Comment:            The GeneXpert MRSA Assay (FDA     approved for NASAL specimens     only), is one component of a      comprehensive MRSA colonization     surveillance program. It is not     intended to diagnose MRSA     infection nor to guide or     monitor treatment for     MRSA infections.    Medical History: Past Medical History  Diagnosis Date  . Diabetes   . HTN (hypertension)   . Hypothyroidism     Medications:  Prescriptions prior to admission  Medication Sig Dispense Refill  . acetaminophen (TYLENOL) 500 MG tablet Take 1,000 mg by mouth daily. At lunch      . aspirin 81 MG tablet Take 81 mg by mouth daily.      Marland Kitchen b complex-vitamin c-folic acid (NEPHRO-VITE) 0.8 MG TABS tablet Take 1 tablet by mouth at bedtime.      . budesonide-formoterol (SYMBICORT) 160-4.5 MCG/ACT inhaler Inhale 2 puffs into the lungs 2 (two) times daily.      . calcium carbonate (OS-CAL) 600 MG TABS tablet Take 600 mg by mouth 2 (two) times daily.      . furosemide (LASIX) 40 MG tablet Take 20 mg by mouth See admin instructions. Was supposed to take tues and Sat but has been getting daily  for swelling      . glipiZIDE (GLUCOTROL XL) 2.5 MG 24 hr tablet Take 2.5 mg by mouth 2 (two) times daily with a meal.      . hydrALAZINE (APRESOLINE) 25 MG tablet Take 25 mg by mouth 2 (two) times daily.      Marland Kitchen HYDROcodone-acetaminophen (NORCO) 7.5-325 MG per tablet Take 1 tablet by mouth 3 (three) times daily. Mostly twice a day but will also use at lunch if lots of pain      . ipratropium-albuterol (DUONEB) 0.5-2.5 (3) MG/3ML SOLN Take 3 mLs by nebulization every 4 (four) hours.      . isosorbide mononitrate (IMDUR) 30 MG 24 hr tablet Take 15 mg by mouth 2 (two) times daily.      Marland Kitchen levothyroxine (SYNTHROID, LEVOTHROID) 50 MCG tablet Take 50 mcg by mouth daily before breakfast.      . loratadine (CLARITIN) 10 MG tablet Take 10 mg by mouth daily.      . magnesium oxide (MAG-OX) 400 MG tablet Take 400 mg by mouth daily.      . metoprolol tartrate (LOPRESSOR) 25 MG tablet Take 12.5 mg by mouth 2 (two) times daily.      . Multiple  Vitamins-Minerals (CENTRUM SILVER PO) Take 1 tablet by mouth daily.      . Multiple Vitamins-Minerals (EYE VITAMINS) CAPS Take 1 capsule by mouth daily.       Assessment: 89yof with multiple antibiotic allergies to switch to ciprofloxacin for Ecoli UTI. Patient is currently afebrile.  - SCr 1.27, Crcl 23  Plan:  1. Cipro  PO daily - recommend to complete 3 more days of therapy for 7 days total 2. Follow-up renal function  Cleon Dew [phone] 10/26/2013,1:25 PM

## 2013-10-26 NOTE — Progress Notes (Signed)
[facility] PROGRESS NOTE  Jodi Ferguson WUJ:[id number] DOB: 1920/08/05 DOA: 10/22/2013 PCP: [name], MD  Assessment/Plan:  Active Problems:    Acute diastolic CHF (congestive heart failure) - Currently on continuous pulse ox but recorded oxygen saturations > 98 percent - supplemental oxygen prn - Pt on Lasix - Carvedilol on board - pt is currently 6 L negative - continue to monitor daily weights.  CAD in native artery - Medical management per EMR, patient on aspirin and statin, reported no chest pain  HTN (hypertension) - stable pt on carvedilol  DM (diabetes mellitus), type 2 - Pt currently on SSI - Also on heart healthy diet/ diabetic diet - Glucerna recently added  Unspecified hypothyroidism - Stable on synthroid  Hyponatremia - resolved and most likely was due to patient being fluid overloaded.  UTI (urinary tract infection) - Pt has been on Rocephin - Will transition to cipro as patient has Urine culture growing pan sensitive E coli  RLE discomfort - obtain uric acid levels - suspect gout as cause, will try trial of colchicine and avoid NSAIDs due to principle problem  Code Status: DNR Family Communication: None at bedside. Disposition Plan: Pending improvement in condition   Consultants:  None  Procedures:  None  Antibiotics: Rocephin  HPI/Subjective: Pt complaining of pain at RLE.  No new complaints otherwise.  Objective: Filed Vitals:   10/26/13 0601  BP: 118/58  Pulse: 64  Temp:   Resp:     Intake/Output Summary (Last 24 hours) at 10/26/13 1451 Last data filed at 10/26/13 0437  Gross per 24 hour  Intake    120 ml  Output    840 ml  Net   -720 ml   Filed Weights   10/24/13 1328 10/25/13 0549 10/26/13 0333  Weight: 69.7 kg (153 lb 10.6 oz) 69 kg (152 lb 1.9 oz) 69.3 kg (152 lb 12.5 oz)    Exam:   General:  Pt in nad, alert and awake  Cardiovascular: S1 and S2 present, no rubs  Respiratory: [place] in place,  oxygenating 98-100 on pulse ox at bedside, no wheezes, CTA BL, decreased breath sounds at bases  Abdomen: soft, NT, ND  Musculoskeletal: no cyanosis or clubbing   Data Reviewed: Basic Metabolic Panel:  Recent Labs Lab 10/23/13 0525 10/24/13 0306 10/24/13 1110 10/24/13 2037 10/24/13 2232 10/25/13 0330 10/26/13 0102  NA 139 136*  --  136*  --  139 141  K 4.9 5.7* 4.8 4.5 4.4 4.2 3.8  CL 102 101  --  99  --  98 97  CO2 24 22  --  24  --  26 29  GLUCOSE 142* 115*  --  120*  --  108* 149*  BUN 27* 30*  --  29*  --  29* 31*  CREATININE 1.30* 1.30*  --  1.34*  --  1.31* 1.27*  CALCIUM 9.6 9.6  --  9.7  --  9.8 9.6  MG  --   --   --  1.9  --   --   --    Liver Function Tests:  Recent Labs Lab 10/22/13 1800  AST 23  ALT 12  ALKPHOS 98  BILITOT 0.3  PROT 6.2  ALBUMIN 2.9*   No results found for this basename: LIPASE, AMYLASE,  in the last 168 hours No results found for this basename: AMMONIA,  in the last 168 hours CBC:  Recent Labs Lab 10/22/13 1800 10/23/13 0525  WBC 7.2 7.1  HGB 10.6* 10.6*  HCT 31.1* 31.4*  MCV 96.0 99.4  PLT 259 239   Cardiac Enzymes:  Recent Labs Lab 10/22/13 1736 10/22/13 2348 10/23/13 0525  TROPONINI 0.93* 1.12* 1.60*   BNP (last 3 results)  Recent Labs  10/22/13 1736  PROBNP 11173.0*   CBG:  Recent Labs Lab 10/25/13 1125 10/25/13 1656 10/25/13 2100 10/26/13 0619 10/26/13 1145  GLUCAP 222* 163* 131* 127* 211*    Recent Results (from the past 240 hour(s))  URINE CULTURE     Status: None   Collection Time    10/22/13  6:10 PM      Result Value Ref Range Status   Specimen Description URINE, CATHETERIZED   Final   Special Requests NONE   Final   Culture  Setup Time     Final   Value: 10/22/2013 19:01     Performed at [facility] Count     Final   Value: >=100,000 COLONIES/ML     Performed at [facility]   Culture     Final   Value: ESCHERICHIA COLI     Performed at [facility]   Report Status 10/25/2013 FINAL   Final   Organism ID, Bacteria ESCHERICHIA COLI   Final  MRSA PCR SCREENING     Status: None   Collection Time    10/22/13  7:46 PM      Result Value Ref Range Status   MRSA by PCR NEGATIVE  NEGATIVE Final   Comment:            The GeneXpert MRSA Assay (FDA     approved for NASAL specimens     only), is one component of a     comprehensive MRSA colonization     surveillance program. It is not     intended to diagnose MRSA     infection nor to guide or     monitor treatment for     MRSA infections.     Studies: Dg Chest Port 1 View  10/25/2013   CLINICAL DATA:  Follow-up pulmonary edema and pleural effusions  EXAM: PORTABLE CHEST - 1 VIEW  COMPARISON:  Portable chest x-ray of October 24, 2013  FINDINGS: Again demonstrated are small bilateral pleural effusions layering inferiorly. The left hemidiaphragm is largely obscured but stable. The cardiac silhouette is top-normal in size. Mild central pulmonary vascular prominence is present and stable. The mediastinum is normal in width. There is gentle curvature of the thoracolumbar spine with convexity towards the left.  IMPRESSION: Stable small bilateral pleural effusions likely secondary to CHF. Stable left basilar atelectasis is suspected.   Electronically Signed   By: David  [place]   On: 10/25/2013 07:47   Dg Chest Port 1 View  10/24/2013   CLINICAL DATA:  Short of breath  EXAM: PORTABLE CHEST - 1 VIEW  COMPARISON:  0019 hr  FINDINGS: Upper normal heart size. Vascular congestion persists. Bilateral pleural effusions left greater than right persists. Basilar edema persists. Pleural fluid in the right minor fissure.  IMPRESSION: Stable volume overload.   Electronically Signed   By: Maryclare Bean M.D.   On: 10/24/2013 19:27    Scheduled Meds: . antiseptic oral rinse  7 mL Mouth Rinse BID  . aspirin EC  325 mg Oral Daily  . atorvastatin  20 mg Oral q1800  . budesonide-formoterol  2 puff Inhalation BID  .  calcium carbonate  1 tablet Oral BID  . carvedilol  3.125 mg Oral BID  WC  . [START ON 10/27/2013] ciprofloxacin  500 mg Oral Q breakfast  . colchicine  0.6 mg Oral Daily  . colchicine  1.2 mg Oral Once  . feeding supplement (GLUCERNA SHAKE)  237 mL Oral Daily  . furosemide  40 mg Intravenous Q12H  . heparin  5,000 Units Subcutaneous 3 times per day  . insulin aspart  0-9 Units Subcutaneous TID WC  . levothyroxine  50 mcg Oral QAC breakfast  . nitroGLYCERIN  0.5 inch Topical 4 times per day   Continuous Infusions:    Time spent: > 35 minutes    [name]  [facility] Pager [phone] If 7PM-7AM, please contact night-coverage at [email or web address], password [facility] 10/26/2013, 2:51 PM  LOS: 4 days

## 2013-10-27 LAB — GLUCOSE, CAPILLARY
Glucose-Capillary: 159 mg/dL — ABNORMAL HIGH (ref 70–99)
Glucose-Capillary: 171 mg/dL — ABNORMAL HIGH (ref 70–99)
Glucose-Capillary: 176 mg/dL — ABNORMAL HIGH (ref 70–99)
Glucose-Capillary: 141 mg/dL — ABNORMAL HIGH (ref 70–99)

## 2013-10-27 LAB — PROTIME-INR
INR: 1.11 (ref 0.00–1.49)
PROTHROMBIN TIME: 14.3 s (ref 11.6–15.2)

## 2013-10-27 MED ORDER — FUROSEMIDE 10 MG/ML IJ SOLN
40.0000 mg | Freq: Once | INTRAMUSCULAR | Status: AC
  Administered 2013-10-27: 40 mg via INTRAVENOUS

## 2013-10-27 NOTE — Progress Notes (Signed)
[facility] PROGRESS NOTE  Jodi Ferguson ZHY:[id number] DOB: 11/14/1920 DOA: 10/22/2013 PCP: [name], MD  Assessment/Plan:  Active Problems:    Acute diastolic CHF (congestive heart failure) - Currently on continuous pulse ox but recorded oxygen saturations > 98- 100 percent - supplemental oxygen prn - Will continue Lasix, and add extra dose - Carvedilol on board - pt is currently 6 L negative - continue to monitor daily weights. Trending down from 69-67  CAD in native artery - Medical management per EMR, patient on aspirin and statin, reported no chest pain  HTN (hypertension) - stable pt on carvedilol  DM (diabetes mellitus), type 2 - Pt currently on SSI - Also on heart healthy diet/ diabetic diet - Glucerna recently added  Unspecified hypothyroidism - Stable on synthroid  Hyponatremia - resolved and most likely was due to patient being fluid overloaded.  UTI (urinary tract infection) - Pt has been on Rocephin - Will transition to cipro as patient has Urine culture growing pan sensitive E coli  RLE discomfort - obtain uric acid levels - suspect gout as cause, improved with trial of colchicine, avoid NSAIDs due to principle problem  Code Status: DNR Family Communication: None at bedside. Disposition Plan: Pending improvement in condition   Consultants:  None  Procedures:  None  Antibiotics: Rocephin  HPI/Subjective: Pt complaining of pain at RLE.  No new complaints otherwise.  Objective: Filed Vitals:   10/27/13 0509  BP: 135/52  Pulse: 60  Temp: 97.8 F (36.6 C)  Resp: 18    Intake/Output Summary (Last 24 hours) at 10/27/13 1444 Last data filed at 10/27/13 0853  Gross per 24 hour  Intake    360 ml  Output    350 ml  Net     10 ml   Filed Weights   10/25/13 0549 10/26/13 0333 10/27/13 0509  Weight: 69 kg (152 lb 1.9 oz) 69.3 kg (152 lb 12.5 oz) 67.631 kg (149 lb 1.6 oz)    Exam:   General:  Pt in nad, alert and  awake  Cardiovascular: S1 and S2 present, no rubs  Respiratory: [place] in place, oxygenating 98-100 on pulse ox at bedside, no wheezes, CTA BL  Abdomen: soft, NT, ND  Musculoskeletal: no cyanosis or clubbing   Data Reviewed: Basic Metabolic Panel:  Recent Labs Lab 10/23/13 0525 10/24/13 0306 10/24/13 1110 10/24/13 2037 10/24/13 2232 10/25/13 0330 10/26/13 0102  NA 139 136*  --  136*  --  139 141  K 4.9 5.7* 4.8 4.5 4.4 4.2 3.8  CL 102 101  --  99  --  98 97  CO2 24 22  --  24  --  26 29  GLUCOSE 142* 115*  --  120*  --  108* 149*  BUN 27* 30*  --  29*  --  29* 31*  CREATININE 1.30* 1.30*  --  1.34*  --  1.31* 1.27*  CALCIUM 9.6 9.6  --  9.7  --  9.8 9.6  MG  --   --   --  1.9  --   --   --    Liver Function Tests:  Recent Labs Lab 10/22/13 1800  AST 23  ALT 12  ALKPHOS 98  BILITOT 0.3  PROT 6.2  ALBUMIN 2.9*   No results found for this basename: LIPASE, AMYLASE,  in the last 168 hours No results found for this basename: AMMONIA,  in the last 168 hours CBC:  Recent Labs Lab 10/22/13 1800 10/23/13 0525  WBC 7.2 7.1  HGB 10.6* 10.6*  HCT 31.1* 31.4*  MCV 96.0 99.4  PLT 259 239   Cardiac Enzymes:  Recent Labs Lab 10/22/13 1736 10/22/13 2348 10/23/13 0525  TROPONINI 0.93* 1.12* 1.60*   BNP (last 3 results)  Recent Labs  10/22/13 1736  PROBNP 11173.0*   CBG:  Recent Labs Lab 10/26/13 1145 10/26/13 1628 10/26/13 2048 10/27/13 0555 10/27/13 1106  GLUCAP 211* 148* 174* 159* 171*    Recent Results (from the past 240 hour(s))  URINE CULTURE     Status: None   Collection Time    10/22/13  6:10 PM      Result Value Ref Range Status   Specimen Description URINE, CATHETERIZED   Final   Special Requests NONE   Final   Culture  Setup Time     Final   Value: 10/22/2013 19:01     Performed at [facility] Count     Final   Value: >=100,000 COLONIES/ML     Performed at [facility]   Culture     Final   Value:  ESCHERICHIA COLI     Performed at [facility]   Report Status 10/25/2013 FINAL   Final   Organism ID, Bacteria ESCHERICHIA COLI   Final  MRSA PCR SCREENING     Status: None   Collection Time    10/22/13  7:46 PM      Result Value Ref Range Status   MRSA by PCR NEGATIVE  NEGATIVE Final   Comment:            The GeneXpert MRSA Assay (FDA     approved for NASAL specimens     only), is one component of a     comprehensive MRSA colonization     surveillance program. It is not     intended to diagnose MRSA     infection nor to guide or     monitor treatment for     MRSA infections.     Studies: No results found.  Scheduled Meds: . antiseptic oral rinse  7 mL Mouth Rinse BID  . aspirin EC  325 mg Oral Daily  . atorvastatin  20 mg Oral q1800  . budesonide-formoterol  2 puff Inhalation BID  . calcium carbonate  1 tablet Oral BID  . carvedilol  3.125 mg Oral BID WC  . ciprofloxacin  500 mg Oral Q breakfast  . colchicine  0.6 mg Oral Daily  . feeding supplement (GLUCERNA SHAKE)  237 mL Oral Daily  . furosemide  40 mg Intravenous Q12H  . heparin  5,000 Units Subcutaneous 3 times per day  . insulin aspart  0-9 Units Subcutaneous TID WC  . levothyroxine  50 mcg Oral QAC breakfast  . nitroGLYCERIN  0.5 inch Topical 4 times per day   Continuous Infusions:    Time spent: > 35 minutes    [name]  [facility] Pager [phone] If 7PM-7AM, please contact night-coverage at [email or web address], password [facility] 10/27/2013, 2:44 PM  LOS: 5 days

## 2013-10-28 ENCOUNTER — Inpatient Hospital Stay (HOSPITAL_COMMUNITY): Payer: Medicare Other

## 2013-10-28 LAB — BASIC METABOLIC PANEL
ANION GAP: 14 (ref 5–15)
BUN: 33 mg/dL — ABNORMAL HIGH (ref 6–23)
CALCIUM: 9.6 mg/dL (ref 8.4–10.5)
CHLORIDE: 94 meq/L — AB (ref 96–112)
CO2: 28 mEq/L (ref 19–32)
Creatinine, Ser: 1.12 mg/dL — ABNORMAL HIGH (ref 0.50–1.10)
GFR calc non Af Amer: 42 mL/min — ABNORMAL LOW (ref >90)
GFR, EST AFRICAN AMERICAN: 48 mL/min — AB (ref >90)
Glucose, Bld: 160 mg/dL — ABNORMAL HIGH (ref 70–99)
Potassium: 3.9 mEq/L (ref 3.7–5.3)
SODIUM: 136 meq/L — AB (ref 137–147)

## 2013-10-28 LAB — PROTIME-INR
INR: 1.15 (ref 0.00–1.49)
PROTHROMBIN TIME: 14.7 s (ref 11.6–15.2)

## 2013-10-28 LAB — GLUCOSE, CAPILLARY
GLUCOSE-CAPILLARY: 145 mg/dL — AB (ref 70–99)
GLUCOSE-CAPILLARY: 158 mg/dL — AB (ref 70–99)
GLUCOSE-CAPILLARY: 131 mg/dL — AB (ref 70–99)
Glucose-Capillary: 160 mg/dL — ABNORMAL HIGH (ref 70–99)

## 2013-10-28 MED ORDER — FUROSEMIDE 10 MG/ML IJ SOLN
60.0000 mg | Freq: Two times a day (BID) | INTRAMUSCULAR | Status: DC
  Administered 2013-10-28 – 2013-10-29 (×2): 60 mg via INTRAVENOUS
  Filled 2013-10-28 (×2): qty 6

## 2013-10-28 MED ORDER — SALINE SPRAY 0.65 % NA SOLN
1.0000 | NASAL | Status: DC | PRN
  Filled 2013-10-28: qty 44

## 2013-10-28 NOTE — Progress Notes (Signed)
Pt c/o of dry nose and requesting saline spray.  MD notified, orders placed.

## 2013-10-28 NOTE — Progress Notes (Addendum)
Physical Therapy Treatment Patient Details Name: Jodi Ferguson MRN: [id number] DOB: [birth year] Today's Date: 10/28/2013    History of Present Illness Pt is a 80 y.o. female with a PMH of DM, HTN, hypothyroidism, recent hospitalization in [facility] last week for what seems like acute diastolic failure and non-STEMI, managed medically and discharged home. She was sent to [facility] ED by her PCP after a hospital followup visit. Apparently since her discharge, patient has not been doing well, has developed worsening SOB and worsening LEE. She has gotten extremely weak as well. She was evaluated at [facility] ED, where she was also complaining of left shoulder and left arm pain, but was not having any chest pain. She was found to have bilateral pleural effusions and other findings suggestive of congestive heart failure. Patient's family requested transfer to [facility] for further evaluation and treatment.    PT Comments    Patient continues to be unsafe to discharge home alone due to decreased tolerance for functional activity and +2 assist for mobility/safety and will benefit from continued post-acute therapy at the SNF level. Will continue to follow and progress as able per POC.   Follow Up Recommendations  SNF;Supervision/Assistance - 24 hour     Equipment Recommendations  Rolling walker with 5" wheels;3in1 (PT)    Recommendations for Other Services       Precautions / Restrictions Precautions Precautions: Fall Restrictions Weight Bearing Restrictions: No    Mobility  Bed Mobility Overal bed mobility: Needs Assistance Bed Mobility: Supine to Sit     Supine to sit: Min assist     General bed mobility comments: VC's for sequencing and technique. Assist for trunk elevation to full sitting position, as well as to scoot hips all the way around to the EOB.  Transfers Overall transfer level: Needs assistance Equipment used: Rolling walker (2  wheeled) Transfers: Sit to/from Stand Sit to Stand: Min assist;+2 physical assistance;+2 safety/equipment         General transfer comment: VC's for hand placement on seated surface for safety. Pt was able to power-up to full standing position with assist, however moving slowly.  Ambulation/Gait Ambulation/Gait assistance: Min assist Ambulation Distance (Feet): 35 Feet Assistive device: Rolling walker (2 wheeled) Gait Pattern/deviations: Decreased stride length;Shuffle;Trunk flexed;Narrow base of support Gait velocity: Decreased Gait velocity interpretation: Below normal speed for age/gender General Gait Details: VC's for improved posture and sequencing with the RW. Pt with difficulty advancing the LLE due to R foot pain (she states gout). Declined seated rest break once out in hallway and asks to turn and walk back to the room. Occasional assist for walker placement and overall support.    Stairs            Wheelchair Mobility    Modified Rankin (Stroke Patients Only)       Balance Overall balance assessment: Needs assistance Sitting-balance support: Feet supported;No upper extremity supported Sitting balance-Leahy Scale: Fair     Standing balance support: Bilateral upper extremity supported;During functional activity Standing balance-Leahy Scale: Poor                      Cognition Arousal/Alertness: Awake/alert Behavior During Therapy: Flat affect Overall Cognitive Status: Within Functional Limits for tasks assessed                      Exercises      General Comments        Pertinent Vitals/Pain Pain Assessment: No/denies  pain    Home Living                      Prior Function            PT Goals (current goals can now be found in the care plan section) Acute Rehab PT Goals Patient Stated Goal: To get stronger PT Goal Formulation: With patient Time For Goal Achievement: 11/07/13 Potential to Achieve Goals:  Good Progress towards PT goals: Progressing toward goals    Frequency  Min 2X/week    PT Plan Current plan remains appropriate    Co-evaluation             End of Session Equipment Utilized During Treatment: Gait belt;Oxygen Activity Tolerance: Patient limited by fatigue Patient left: with call bell/phone within reach;in chair;with chair alarm set     Time: 1610-9604 PT Time Calculation (min): 18 min  Charges:  $Gait Training: 8-22 mins                    G Codes:      Ruthann Cancer 11-24-13, 6:20 PM  Ruthann Cancer, PT, DPT Acute Rehabilitation Services Pager: [phone]

## 2013-10-28 NOTE — Progress Notes (Signed)
[facility] PROGRESS NOTE  Jodi Ferguson RUE:[id number] DOB: [birth year] DOA: 10/22/2013 PCP: [name], MD  Assessment/Plan:  Active Problems:    Acute diastolic CHF (congestive heart failure) - Currently on continuous pulse ox but recorded oxygen saturations > 98- 100 percent - supplemental oxygen prn - Will continue Lasix, and add extra dose - Carvedilol on board - pt is currently 7 L negative - continue to monitor daily weights which are currently Trending down from 69-67  CAD in native artery - Medical management per EMR, patient on aspirin and statin, reported no chest pain  HTN (hypertension) - stable pt on carvedilol  DM (diabetes mellitus), type 2  - Pt currently on SSI - Also on heart healthy diet/ diabetic diet - Glucerna recently added  Unspecified hypothyroidism - Stable on synthroid  Hyponatremia - resolved and most likely was due to patient being fluid overloaded.  UTI (urinary tract infection) - Pt has been on Rocephin - Will transition to cipro as patient has Urine culture growing pan sensitive E coli - on Day 5/7  RLE discomfort - obtain uric acid levels - suspect gout as cause, improved with trial of colchicine, avoid NSAIDs due to principle problem  Code Status: DNR Family Communication: None at bedside. Disposition Plan: Pending improvement in condition   Consultants:  None  Procedures:  None  Antibiotics: Rocephin  HPI/Subjective: Pt states that she feels short of breath. States that extra lasix dose did help.  Objective: Filed Vitals:   10/28/13 0526  BP: 128/58  Pulse: 78  Temp: 97.2 F (36.2 C)  Resp: 20    Intake/Output Summary (Last 24 hours) at 10/28/13 1329 Last data filed at 10/28/13 0818  Gross per 24 hour  Intake      0 ml  Output   1100 ml  Net  -1100 ml   Filed Weights   10/26/13 0333 10/27/13 0509 10/28/13 0526  Weight: 69.3 kg (152 lb 12.5 oz) 67.631 kg (149 lb 1.6 oz) 67.021 kg (147 lb  12.1 oz)    Exam:   General:  Pt in nad, alert and awake  Cardiovascular: S1 and S2 present, no rubs  Respiratory: [place] in place, oxygenating 98-100 on pulse ox at bedside, no wheezes, CTA BL  Abdomen: soft, NT, ND  Musculoskeletal: no cyanosis or clubbing   Data Reviewed: Basic Metabolic Panel:  Recent Labs Lab 10/24/13 0306  10/24/13 2037 10/24/13 2232 10/25/13 0330 10/26/13 0102 10/28/13 1043  NA 136*  --  136*  --  139 141 136*  K 5.7*  < > 4.5 4.4 4.2 3.8 3.9  CL 101  --  99  --  98 97 94*  CO2 22  --  24  --  GLUCOSE 115*  --  120*  --  108* 149* 160*  BUN 30*  --  29*  --  29* 31* 33*  CREATININE 1.30*  --  1.34*  --  1.31* 1.27* 1.12*  CALCIUM 9.6  --  9.7  --  9.8 9.6 9.6  MG  --   --  1.9  --   --   --   --   < > = values in this interval not displayed. Liver Function Tests:  Recent Labs Lab 10/22/13 1800  AST 23  ALT 12  ALKPHOS 98  BILITOT 0.3  PROT 6.2  ALBUMIN 2.9*   No results found for this basename: LIPASE, AMYLASE,  in the last 168 hours No results found  for this basename: AMMONIA,  in the last 168 hours CBC:  Recent Labs Lab 10/22/13 1800 10/23/13 0525  WBC 7.2 7.1  HGB 10.6* 10.6*  HCT 31.1* 31.4*  MCV 96.0 99.4  PLT 259 239   Cardiac Enzymes:  Recent Labs Lab 10/22/13 1736 10/22/13 2348 10/23/13 0525  TROPONINI 0.93* 1.12* 1.60*   BNP (last 3 results)  Recent Labs  10/22/13 1736  PROBNP 11173.0*   CBG:  Recent Labs Lab 10/27/13 1106 10/27/13 1619 10/27/13 2114 10/28/13 0530 10/28/13 1112  GLUCAP 171* 176* 141* 145* 158*    Recent Results (from the past 240 hour(s))  URINE CULTURE     Status: None   Collection Time    10/22/13  6:10 PM      Result Value Ref Range Status   Specimen Description URINE, CATHETERIZED   Final   Special Requests NONE   Final   Culture  Setup Time     Final   Value: 10/22/2013 19:01     Performed at [facility] Count     Final   Value: >=100,000  COLONIES/ML     Performed at [facility]   Culture     Final   Value: ESCHERICHIA COLI     Performed at [facility]   Report Status 10/25/2013 FINAL   Final   Organism ID, Bacteria ESCHERICHIA COLI   Final  MRSA PCR SCREENING     Status: None   Collection Time    10/22/13  7:46 PM      Result Value Ref Range Status   MRSA by PCR NEGATIVE  NEGATIVE Final   Comment:            The GeneXpert MRSA Assay (FDA     approved for NASAL specimens     only), is one component of a     comprehensive MRSA colonization     surveillance program. It is not     intended to diagnose MRSA     infection nor to guide or     monitor treatment for     MRSA infections.     Studies: No results found.  Scheduled Meds: . antiseptic oral rinse  7 mL Mouth Rinse BID  . aspirin EC  325 mg Oral Daily  . atorvastatin  20 mg Oral q1800  . budesonide-formoterol  2 puff Inhalation BID  . calcium carbonate  1 tablet Oral BID  . carvedilol  3.125 mg Oral BID WC  . ciprofloxacin  500 mg Oral Q breakfast  . colchicine  0.6 mg Oral Daily  . feeding supplement (GLUCERNA SHAKE)  237 mL Oral Daily  . furosemide  60 mg Intravenous Q12H  . heparin  5,000 Units Subcutaneous 3 times per day  . insulin aspart  0-9 Units Subcutaneous TID WC  . levothyroxine  50 mcg Oral QAC breakfast  . nitroGLYCERIN  0.5 inch Topical 4 times per day   Continuous Infusions:    Time spent: > 35 minutes    [name]  [facility] Pager [phone] If 7PM-7AM, please contact night-coverage at [email or web address], password [facility] 10/28/2013, 1:29 PM  LOS: 6 days

## 2013-10-29 LAB — GLUCOSE, CAPILLARY
Glucose-Capillary: 176 mg/dL — ABNORMAL HIGH (ref 70–99)
Glucose-Capillary: 190 mg/dL — ABNORMAL HIGH (ref 70–99)
Glucose-Capillary: 201 mg/dL — ABNORMAL HIGH (ref 70–99)

## 2013-10-29 MED ORDER — FUROSEMIDE 40 MG PO TABS
40.0000 mg | ORAL_TABLET | Freq: Two times a day (BID) | ORAL | Status: DC
  Administered 2013-10-29 – 2013-10-30 (×2): 40 mg via ORAL
  Filled 2013-10-29 (×4): qty 1

## 2013-10-29 MED ORDER — FUROSEMIDE 10 MG/ML IJ SOLN
INTRAMUSCULAR | Status: AC
  Filled 2013-10-29: qty 8

## 2013-10-29 MED ORDER — COLCHICINE 0.6 MG PO TABS
0.3000 mg | ORAL_TABLET | Freq: Every day | ORAL | Status: DC
  Administered 2013-10-29 – 2013-10-30 (×2): 0.3 mg via ORAL
  Filled 2013-10-29 (×3): qty 0.5

## 2013-10-29 NOTE — Progress Notes (Signed)
IV outdated and removed. MD aware, ordered to not have another placed due to possible discharge.   Denham Mose A 11:07 AM 10/29/2013

## 2013-10-29 NOTE — Clinical Social Work Psychosocial (Signed)
     Clinical Social Work Department BRIEF PSYCHOSOCIAL ASSESSMENT 10/29/2013  Patient:  Jodi Ferguson, Jodi Ferguson     Account Number:  [id number]     Admit date:  10/22/2013  Clinical Social Worker:  Iona Coach  Date/Time:  10/29/2013 02:25 PM  Referred by:  Physician  Date Referred:  10/28/2013 Referred for  SNF Placement   Other Referral:   Interview type:  Other - See comment Other interview type:   Patient and daughter Dawn    PSYCHOSOCIAL DATA Living Status:  WITH DISABLED ADULT Admitted from facility:   Level of care:   Primary support name:  Theresa Duty  (c) [phone] Primary support relationship to patient:  CHILD, ADULT Degree of support available:   Strong support  Daughter:  Estrella Deeds  [address] Current Concerns  Post-Acute Placement   Other Concerns:    SOCIAL WORK ASSESSMENT / PLAN 80 year old female- admitted from home where she lives with her son Louie Casa.  CSW met with patient and later spoke with her daughter Arrie Aran (per her permission) via phone to discuss d/c options.  Physical Therapy is recommending short term SNF and patient is agreeable.  Discussed bed search process and patient requested that CSW talk to her daughters- [facility] or [place].  Patient is agreeable to whatever they choose. She lives in [place] and would like placement there- but does not have a preference.  CSW discussed bed search process with Dawn who agreed to search. No preference listed but stated that they only wanted [facility] placement.  Patient has [employer] and will need to determine which facilities accept this insurance.  Fl2 completed and will be placed on chart for MD's signature.   Assessment/plan status:  Psychosocial Support/Ongoing Assessment of Needs Other assessment/ plan:   Information/referral to community resources:   SNF list placed with patient for her daughters to review  Request for SNF approval sent to [place] for authorization.  Message left for return call.    PATIENTS/FAMILYS RESPONSE TO PLAN OF CARE: Pateint is alert and oriented; lying in bed and with small fan close to her face.  She had a washcloth on her head and one pressed to the side of her face.  CSW asked patient if she was ok and she replied that she was "feeling ok"- denied any concerns at this time. She is accepting of SNF placement and wants her daughters to be invovled to make SNF decisions. "I will do whatever they say."  Patient was pleasant and appears calm. CSW provided support.

## 2013-10-29 NOTE — Clinical Social Work Placement (Addendum)
     Clinical Social Work Department CLINICAL SOCIAL WORK PLACEMENT NOTE 10/30/2013  Patient:  DANAJA, LASOTA  Account Number:  [id number] Admit date:  10/22/2013  Clinical Social Worker:  Lupita Leash Lamara Brecht, LCSWA  Date/time:  10/29/2013 02:55 PM  Clinical Social Work is seeking post-discharge placement for this patient at the following level of care:   SKILLED NURSING   (*CSW will update this form in Epic as items are completed)   10/29/2013  Patient/family provided with [facility] Department of Clinical Social Works list of facilities offering this level of care within the geographic area requested by the patient (or if unable, by the patients family).  10/29/2013  Patient/family informed of their freedom to choose among providers that offer the needed level of care, that participate in Medicare, Medicaid or managed care program needed by the patient, have an available bed and are willing to accept the patient.  10/29/2013  Patient/family informed of [facility] ownership interest in [facility], as well as of the fact that they are under no obligation to receive care at this facility.  PASARR submitted to EDS on 10/29/2013 PASARR number received on 10/29/2013  FL2 transmitted to all facilities in geographic area requested by pt/family on  10/29/2013 FL2 transmitted to all facilities within larger geographic area on   Patient informed that his/her managed care company has contracts with or will negotiate with  certain facilities, including the following:   Joette Catching- requires authorization    10/30/13  Approved per Kimber Relic- [id number] X's 7 days     Patient/family informed of bed offers received:  10/30/2013 Patient chooses bed at [facility] SNF Physician recommends and patient chooses bed at    Patient to be transferred to [facility] SNF on  10/30/2013 Patient to be transferred to facility by Ambulance  [facility]) Patient and family  notified of transfer on 10/30/2013 Name of family member notified:  Myer Peer and Bufford Lope -daughters  The following physician request were entered in Epic: Physician Request  Please sign FL2.  Please prepare priority discharge summary and prescriptions.    Additional Comments: 10/30/13  Ok per MD for d/c today to SNF this afternoon.  CSW has left message for admissions at [facility] to faciltate signing of admission paperwork.  Nursing will call report. Patient and daughters are pleased with dc plan.  CSW signing off

## 2013-10-29 NOTE — Progress Notes (Signed)
[facility] PROGRESS NOTE  Jodi Ferguson ZOX:[id number] DOB: Mar 23, 1920 DOA: 10/22/2013 PCP: [name], MD Brief Narrative: 80 y/o with history of CAD, DM 2, unspecified hypothyroidism, who presented in acute diastolic congestive heart failure. Hospital stay also complicated by gout flair.   Assessment/Plan:  Active Problems:    Acute diastolic CHF (congestive heart failure) - Currently on continuous pulse ox but recorded oxygen saturations > 98- 100 percent - supplemental oxygen prn - Will continue Lasix and transition to oral regimen - Carvedilol on board - pt is currently 9 L negative - continue to monitor daily weights which are currently Trending down from 69-64.3  CAD in native artery - Medical management per EMR, patient on aspirin and statin, reported no chest pain  HTN (hypertension) - stable pt on carvedilol  DM (diabetes mellitus), type 2  - Pt currently on SSI - Also on heart healthy diet/ diabetic diet - Glucerna recently added  Unspecified hypothyroidism - Stable on synthroid  Hyponatremia - resolved and most likely was due to patient being fluid overloaded.  UTI (urinary tract infection) - Patient completed 7 days of antibiotic therapy - Will transition to cipro as patient has Urine culture growing pan sensitive E coli   RLE discomfort/ gout flare - obtain uric acid levels -Improved with trial of cochicine (dose will be reduced based on Creatinine clearance)  Code Status: DNR Family Communication: None at bedside. Disposition Plan: With continued improvement may consider discharge on 10/30/2013   Consultants:  None  Procedures:  None  Antibiotics: None  HPI/Subjective: Patient has no new complaints today.  Objective: Filed Vitals:   10/29/13 1356  BP: 127/43  Pulse: 42  Temp: 97.8 F (36.6 C)  Resp: 18    Intake/Output Summary (Last 24 hours) at 10/29/13 1625 Last data filed at 10/29/13 1300  Gross per 24 hour   Intake    360 ml  Output   1100 ml  Net   -740 ml   Filed Weights   10/27/13 0509 10/28/13 0526 10/29/13 0503  Weight: 67.631 kg (149 lb 1.6 oz) 67.021 kg (147 lb 12.1 oz) 64.3 kg (141 lb 12.1 oz)    Exam:   General:  Pt in nad, alert and awake  Cardiovascular: S1 and S2 present, no rubs  Respiratory: [place] in place but oxygen turned off, oxygenating 98-100 on pulse ox at bedside, no wheezes, CTA BL  Abdomen: soft, NT, ND  Musculoskeletal: no cyanosis or clubbing   Data Reviewed: Basic Metabolic Panel:  Recent Labs Lab 10/24/13 0306  10/24/13 2037 10/24/13 2232 10/25/13 0330 10/26/13 0102 10/28/13 1043  NA 136*  --  136*  --  139 141 136*  K 5.7*  < > 4.5 4.4 4.2 3.8 3.9  CL 101  --  99  --  98 97 94*  CO2 22  --  24  --  GLUCOSE 115*  --  120*  --  108* 149* 160*  BUN 30*  --  29*  --  29* 31* 33*  CREATININE 1.30*  --  1.34*  --  1.31* 1.27* 1.12*  CALCIUM 9.6  --  9.7  --  9.8 9.6 9.6  MG  --   --  1.9  --   --   --   --   < > = values in this interval not displayed. Liver Function Tests:  Recent Labs Lab 10/22/13 1800  AST 23  ALT 12  ALKPHOS 98  BILITOT 0.3  PROT 6.2  ALBUMIN 2.9*   No results found for this basename: LIPASE, AMYLASE,  in the last 168 hours No results found for this basename: AMMONIA,  in the last 168 hours CBC:  Recent Labs Lab 10/22/13 1800 10/23/13 0525  WBC 7.2 7.1  HGB 10.6* 10.6*  HCT 31.1* 31.4*  MCV 96.0 99.4  PLT 259 239   Cardiac Enzymes:  Recent Labs Lab 10/22/13 1736 10/22/13 2348 10/23/13 0525  TROPONINI 0.93* 1.12* 1.60*   BNP (last 3 results)  Recent Labs  10/22/13 1736  PROBNP 11173.0*   CBG:  Recent Labs Lab 10/28/13 1112 10/28/13 1558 10/28/13 2114 10/29/13 1040 10/29/13 1606  GLUCAP 158* 131* 160* 176* 190*    Recent Results (from the past 240 hour(s))  URINE CULTURE     Status: None   Collection Time    10/22/13  6:10 PM      Result Value Ref Range Status   Specimen  Description URINE, CATHETERIZED   Final   Special Requests NONE   Final   Culture  Setup Time     Final   Value: 10/22/2013 19:01     Performed at [facility] Count     Final   Value: >=100,000 COLONIES/ML     Performed at [facility]   Culture     Final   Value: ESCHERICHIA COLI     Performed at [facility]   Report Status 10/25/2013 FINAL   Final   Organism ID, Bacteria ESCHERICHIA COLI   Final  MRSA PCR SCREENING     Status: None   Collection Time    10/22/13  7:46 PM      Result Value Ref Range Status   MRSA by PCR NEGATIVE  NEGATIVE Final   Comment:            The GeneXpert MRSA Assay (FDA     approved for NASAL specimens     only), is one component of a     comprehensive MRSA colonization     surveillance program. It is not     intended to diagnose MRSA     infection nor to guide or     monitor treatment for     MRSA infections.     Studies: Dg Chest Port 1 View  10/28/2013   CLINICAL DATA:  Acute CHF, shortness of breath, on diuretic therapy.  EXAM: PORTABLE CHEST - 1 VIEW  COMPARISON:  10/25/2013.  FINDINGS: Trachea is midline. Heart size stable. Thoracic aorta is calcified. Small bilateral effusions and mild basilar dependent airspace disease appear unchanged. Question left basilar collapse/consolidation.  IMPRESSION: 1. Mild congestive heart failure, stable. 2. Question left basilar collapse/consolidation.   Electronically Signed   By: Leanna Battles M.D.   On: 10/28/2013 14:03    Scheduled Meds: . antiseptic oral rinse  7 mL Mouth Rinse BID  . aspirin EC  325 mg Oral Daily  . atorvastatin  20 mg Oral q1800  . budesonide-formoterol  2 puff Inhalation BID  . calcium carbonate  1 tablet Oral BID  . carvedilol  3.125 mg Oral BID WC  . colchicine  0.3 mg Oral Daily  . feeding supplement (GLUCERNA SHAKE)  237 mL Oral Daily  . furosemide  40 mg Oral BID  . heparin  5,000 Units Subcutaneous 3 times per day  . insulin aspart  0-9  Units Subcutaneous TID WC  . levothyroxine  50 mcg Oral QAC breakfast  .  nitroGLYCERIN  0.5 inch Topical 4 times per day   Continuous Infusions:    Time spent: > 35 minutes    [name]  [facility] Pager [phone] If 7PM-7AM, please contact night-coverage at [email or web address], password [facility] 10/29/2013, 4:25 PM  LOS: 7 days

## 2013-10-30 DIAGNOSIS — N3000 Acute cystitis without hematuria: Secondary | ICD-10-CM

## 2013-10-30 LAB — BASIC METABOLIC PANEL
Anion gap: 13 (ref 5–15)
BUN: 39 mg/dL — ABNORMAL HIGH (ref 6–23)
CALCIUM: 10 mg/dL (ref 8.4–10.5)
CHLORIDE: 97 meq/L (ref 96–112)
CO2: 29 meq/L (ref 19–32)
Creatinine, Ser: 1.29 mg/dL — ABNORMAL HIGH (ref 0.50–1.10)
GFR calc Af Amer: 41 mL/min — ABNORMAL LOW (ref >90)
GFR calc non Af Amer: 35 mL/min — ABNORMAL LOW (ref >90)
GLUCOSE: 126 mg/dL — AB (ref 70–99)
Potassium: 3.7 mEq/L (ref 3.7–5.3)
SODIUM: 139 meq/L (ref 137–147)

## 2013-10-30 LAB — GLUCOSE, CAPILLARY
Glucose-Capillary: 128 mg/dL — ABNORMAL HIGH (ref 70–99)
Glucose-Capillary: 219 mg/dL — ABNORMAL HIGH (ref 70–99)

## 2013-10-30 MED ORDER — COLCHICINE 0.6 MG PO TABS: 0.3000 mg | ORAL_TABLET | Freq: Every day | ORAL | Status: AC

## 2013-10-30 MED ORDER — FUROSEMIDE 40 MG PO TABS: 40.0000 mg | ORAL_TABLET | Freq: Every day | ORAL | Status: DC

## 2013-10-30 MED ORDER — CARVEDILOL 3.125 MG PO TABS: 3.1250 mg | ORAL_TABLET | Freq: Two times a day (BID) | ORAL | Status: AC

## 2013-10-30 MED ORDER — FUROSEMIDE 40 MG PO TABS: 40.0000 mg | ORAL_TABLET | Freq: Every day | ORAL | Status: AC

## 2013-10-30 NOTE — Discharge Summary (Addendum)
Physician Discharge Summary  Jodi Ferguson WJX:[id number] DOB: Aug 25, 1920 DOA: 10/22/2013  PCP: [name], MD  Admit date: 10/22/2013 Discharge date: 10/30/2013  Time spent: 45 minutes  Recommendations for Outpatient Follow-up:  1. Follow up [facility] to reassess creatinine, management of diabetes and gout 2. Discharge to SNF  Discharge Diagnoses:  Active Problems:   Acute diastolic CHF (congestive heart failure)   CAD in native artery   HTN (hypertension)   DM (diabetes mellitus), type 2   Unspecified hypothyroidism   Hyponatremia   UTI (urinary tract infection)   Discharge Condition: stable  Diet recommendation: heart healthy/diabetic diet  Filed Weights   10/28/13 0526 10/29/13 0503 10/30/13 0719  Weight: 67.021 kg (147 lb 12.1 oz) 64.3 kg (141 lb 12.1 oz) 64.638 kg (142 lb 8 oz)    History of present illness:  81 with PMH of diabetes, hypertension, hypothyroidism. Recently hospitalized at [facility] for apparent acute diastolic heart failure with associated non-STEMI, for which she was medically managed and discharged home. On a subsequent follow up with her PCP, she was found to have progressive shortness of breath and increasing lower extremity edema with associated weakness, and was sent to the hospital.   Family requested the patient transferred to [facility]. Upon arrival to [facility], she was noted to haveshortness of breath and was complaining of left shoulder and left arm pain,  which she described as her typical pattern of  "heart attack". chest x-ray revealed bilateral pleural effusions and other interstitial markings consistent with congestive heart failure. In addition she had mild elevation in creatinine with associated hyponatremia. A troponin level was checked at [facility] prior to transfer and was 0.83. Since admission patient has continued with the left arm and shoulder pain,  shortness of breath, and anxiety because of the ongoing respiratory  symptoms.  Troponin increased to 1.6. Urinalysis was also abnormal and was concerning for urinary tract infection prompting initiation of empiric antibiotics on 9/2. Her BUN and creatinine have actually risen somewhat since admission. She has had a moderate response to attempts to diurese. Because of ongoing respiratory symptoms of presumed ischemic etiology nitroglycerin paste was added on 9/2. Hospital stay complicated by gout flair, which she was treated with oral colchicine, with much improvement.  Hospital Course:   Acute diastolic CHF (congestive heart failure)   -2D echo with preserved LVF without regional wall motion abnormalities and EF of 55-60%.  Kept on Carvedilol and diuresed with IV lasix with total body weight decrease of about 5.67 pounds, and net I and O about negative 9 liters. O2 requirements have decreased and currently at 98% O2 sat on 1 L of [place].   -On discharge,  transitioned to once daily 40 mg of oral Lasix. -Metoprolol held due to bradycardia  CAD in native artery  -Managed medically-family and patient did not want to pursue invasice measures like cardica catherization.  Placed on aspirin, statin, BB. On discharge, continue aspirin.  Will discontinue statin on discharge as she appears to have mild renal insufficiency, creatinine 1.29 on discharge.  Both patient and family have agreed to not pursue aggressive measures and want comfort care  Mild Renal insufficiency -On discharge creat 1.29.  Will stop statins on discharge to avoid aggravating kidneys.  HTN (hypertension)  -stable on discharge. Managed on carvedilol.  -on discharge, continue home regimen of hydralazine and carvedilol  DM (diabetes mellitus), type 2  - Hgb A1c at 7.4; CBGs stable on discharge.   - Paced on SSI;  on discharge, resume home regimen of oral glipizide.  Unspecified hypothyroidism  - Stable- continue home regimen of synthroid  Hyponatremia  - resolved with fluid restriction and diuresis.  Likely due to patient being fluid overloaded.   Ecoli UTI (urinary tract infection)  -initially treated empirically with Rocephin.  Urine culture yielded pan sensitive E coli and was transitioned ciprofloxacin, and has thus completed a 7 day antibiotic therapy.   RLE discomfort/ gout flare  - uric acid levels at 14.5- states much improvement on discharge. -Treated with colchicine and on discharge, colchicine is djusted based on creatinine clearance:  0.3 mg PO once daily   Procedures:  2D echo  Consultations:  Cardiology  Discharge Exam: Filed Vitals:   10/30/13 0719  BP: 115/46  Pulse: 50  Temp:   Resp: 17     Exam General: Alert and oriented elderly woman in NAD.sitting in chair. On 1 L [place] 98% sat Eyes: Anicteric account.  Cardiovascular: Regular rate and rhythm.  No murmurs, rubs, or gallops. Respiratory: Clear to auscultate bilaterally.  No rhonchi or crepitations. Abdomen: Soft nontender bowel sounds present. No guarding or rigidity.  Musculoskeletal: No edema Psychiatric: Appears normal.  Neurologic: Alert awake oriented to time place and person.    Discharge Instructions      Discharge Instructions   (HEART FAILURE PATIENTS) Call MD:  Anytime you have any of the following symptoms: 1) 3 pound weight gain in 24 hours or 5 pounds in 1 week 2) shortness of breath, with or without a dry hacking cough 3) swelling in the hands, feet or stomach 4) if you have to sleep on extra pillows at night in order to breathe.    Complete by:  As directed      Call MD for:  difficulty breathing, headache or visual disturbances    Complete by:  As directed      Call MD for:  extreme fatigue    Complete by:  As directed      Call MD for:  persistant nausea and vomiting    Complete by:  As directed      Call MD for:  severe uncontrolled pain    Complete by:  As directed      Call MD for:  temperature >100.4    Complete by:  As directed      Diet - low sodium heart healthy     Complete by:  As directed      Increase activity slowly    Complete by:  As directed             Medication List    STOP taking these medications       metoprolol tartrate 25 MG tablet  Commonly known as:  LOPRESSOR      TAKE these medications       acetaminophen 500 MG tablet  Commonly known as:  TYLENOL  Take 1,000 mg by mouth daily. At lunch     aspirin 81 MG tablet  Take 81 mg by mouth daily.     b complex-vitamin c-folic acid 0.8 MG Tabs tablet  Take 1 tablet by mouth at bedtime.     budesonide-formoterol 160-4.5 MCG/ACT inhaler  Commonly known as:  SYMBICORT  Inhale 2 puffs into the lungs 2 (two) times daily.     calcium carbonate 600 MG Tabs tablet  Commonly known as:  OS-CAL  Take 600 mg by mouth 2 (two) times daily.     carvedilol 3.125 MG tablet  Commonly known as:  COREG  Take 1 tablet (3.125 mg total) by mouth 2 (two) times daily with a meal.     CENTRUM SILVER PO  Take 1 tablet by mouth daily.     EYE VITAMINS Caps  Take 1 capsule by mouth daily.     colchicine 0.6 MG tablet  Take 0.5 tablets (0.3 mg total) by mouth daily.     furosemide 40 MG tablet  Commonly known as:  LASIX  Take 1 tablet (40 mg total) by mouth daily.     glipiZIDE 2.5 MG 24 hr tablet  Commonly known as:  GLUCOTROL XL  Take 2.5 mg by mouth 2 (two) times daily with a meal.     hydrALAZINE 25 MG tablet  Commonly known as:  APRESOLINE  Take 25 mg by mouth 2 (two) times daily.     HYDROcodone-acetaminophen 7.5-325 MG per tablet  Commonly known as:  NORCO  Take 1 tablet by mouth 3 (three) times daily. Mostly twice a day but will also use at lunch if lots of pain     ipratropium-albuterol 0.5-2.5 (3) MG/3ML Soln  Commonly known as:  DUONEB  Take 3 mLs by nebulization every 4 (four) hours.     isosorbide mononitrate 30 MG 24 hr tablet  Commonly known as:  IMDUR  Take 15 mg by mouth 2 (two) times daily.     levothyroxine 50 MCG tablet  Commonly known as:  SYNTHROID,  LEVOTHROID  Take 50 mcg by mouth daily before breakfast.     loratadine 10 MG tablet  Commonly known as:  CLARITIN  Take 10 mg by mouth daily.     magnesium oxide 400 MG tablet  Commonly known as:  MAG-OX  Take 400 mg by mouth daily.       Allergies  Allergen Reactions  . Benadryl [Diphenhydramine Hcl]   . Codeine Nausea Only  . Penicillins Rash  . Sulfa Antibiotics Rash   Follow-up Information   Follow up with Selinda Flavin, MD.   Specialty:  Family Medicine   Contact information:   [address] [place] [address] [phone]       Follow up with [name], MD In 3 days.   Specialty:  Family Medicine   Contact information:   [address] [place] [address] [phone]        The results of significant diagnostics from this hospitalization (including imaging, microbiology, ancillary and laboratory) are listed below for reference.    Significant Diagnostic Studies: Dg Chest Port 1 View  10/28/2013   CLINICAL DATA:  Acute CHF, shortness of breath, on diuretic therapy.  EXAM: PORTABLE CHEST - 1 VIEW  COMPARISON:  10/25/2013.  FINDINGS: Trachea is midline. Heart size stable. Thoracic aorta is calcified. Small bilateral effusions and mild basilar dependent airspace disease appear unchanged. Question left basilar collapse/consolidation.  IMPRESSION: 1. Mild congestive heart failure, stable. 2. Question left basilar collapse/consolidation.   Electronically Signed   By: Leanna Battles M.D.   On: 10/28/2013 14:03   Dg Chest Port 1 View  10/25/2013   CLINICAL DATA:  Follow-up pulmonary edema and pleural effusions  EXAM: PORTABLE CHEST - 1 VIEW  COMPARISON:  Portable chest x-ray of October 24, 2013  FINDINGS: Again demonstrated are small bilateral pleural effusions layering inferiorly. The left hemidiaphragm is largely obscured but stable. The cardiac silhouette is top-normal in size. Mild central pulmonary vascular prominence is present and stable. The  mediastinum is normal in  width. There is gentle curvature of the thoracolumbar spine with convexity towards the left.  IMPRESSION: Stable small bilateral pleural effusions likely secondary to CHF. Stable left basilar atelectasis is suspected.   Electronically Signed   By: David  [place]   On: 10/25/2013 07:47   Dg Chest Port 1 View  10/24/2013   CLINICAL DATA:  Short of breath  EXAM: PORTABLE CHEST - 1 VIEW  COMPARISON:  0019 hr  FINDINGS: Upper normal heart size. Vascular congestion persists. Bilateral pleural effusions left greater than right persists. Basilar edema persists. Pleural fluid in the right minor fissure.  IMPRESSION: Stable volume overload.   Electronically Signed   By: Maryclare Bean M.D.   On: 10/24/2013 19:27   Dg Chest Port 1 View  10/23/2013   CLINICAL DATA:  sob  EXAM: PORTABLE CHEST - 1 VIEW  COMPARISON:  None available  FINDINGS: Heart size upper limits normal. Bilateral pleural effusions left greater than right. Infrahilar consolidation/ atelectasis. Platelike atelectasis in the right mid lung. Central pulmonary vascular congestion. Atheromatous aortic arch. Visualized skeletal structures are unremarkable.  IMPRESSION: 1. Cardiomegaly, pleural effusions, and bilateral edema/infiltrates suggesting CHF.   Electronically Signed   By: Oley Balm M.D.   On: 10/23/2013 02:21    Microbiology: Recent Results (from the past 240 hour(s))  URINE CULTURE     Status: None   Collection Time    10/22/13  6:10 PM      Result Value Ref Range Status   Specimen Description URINE, CATHETERIZED   Final   Special Requests NONE   Final   Culture  Setup Time     Final   Value: 10/22/2013 19:01     Performed at [facility] Count     Final   Value: >=100,000 COLONIES/ML     Performed at [facility]   Culture     Final   Value: ESCHERICHIA COLI     Performed at [facility]   Report Status 10/25/2013 FINAL   Final   Organism ID, Bacteria ESCHERICHIA COLI    Final  MRSA PCR SCREENING     Status: None   Collection Time    10/22/13  7:46 PM      Result Value Ref Range Status   MRSA by PCR NEGATIVE  NEGATIVE Final   Comment:            The GeneXpert MRSA Assay (FDA     approved for NASAL specimens     only), is one component of a     comprehensive MRSA colonization     surveillance program. It is not     intended to diagnose MRSA     infection nor to guide or     monitor treatment for     MRSA infections.     Labs: Basic Metabolic Panel:  Recent Labs Lab 10/24/13 0306  10/24/13 2037 10/24/13 2232 10/25/13 0330 10/26/13 0102 10/28/13 1043 10/30/13 0509  NA 136*  --  136*  --  139 141 136* 139  K 5.7*  < > 4.5 4.4 4.2 3.8 3.9 3.7  CL 101  --  99  --  98 97 94* 97  CO2 22  --  24  --  GLUCOSE 115*  --  120*  --  108* 149* 160* 126*  BUN 30*  --  29*  --  29* 31* 33* 39*  CREATININE 1.30*  --  1.34*  --  1.31* 1.27* 1.12* 1.29*  CALCIUM 9.6  --  9.7  --  9.8 9.6 9.6 10.0  MG  --   --  1.9  --   --   --   --   --   < > = values in this interval not displayed. Liver Function Tests: No results found for this basename: AST, ALT, ALKPHOS, BILITOT, PROT, ALBUMIN,  in the last 168 hours No results found for this basename: LIPASE, AMYLASE,  in the last 168 hours No results found for this basename: AMMONIA,  in the last 168 hours CBC: No results found for this basename: WBC, NEUTROABS, HGB, HCT, MCV, PLT,  in the last 168 hours Cardiac Enzymes: No results found for this basename: CKTOTAL, CKMB, CKMBINDEX, TROPONINI,  in the last 168 hours BNP: BNP (last 3 results)  Recent Labs  10/22/13 1736  PROBNP 11173.0*   CBG:  Recent Labs Lab 10/29/13 1040 10/29/13 1606 10/29/13 2140 10/30/13 0647 10/30/13 1134  GLUCAP 176* 190* 201* 128* 219*       Signed:  Illa Level PA-C  [facility] 10/30/2013, 12:39 PM  Addendum  I personally saw and evaluated patient on the day of discharge, spoke with her  family members present at bedside. We discussed goals of care. Family would like for her to receive some physical therapy at SNF setting. They also understand patient's advanced age, multiple comorbidies, including heart failure, and this could limit Jodi Ferguson's ability to participate in physical therapy. Will see how she responds to therapy. If she does not improve will consider transition to comfort care. She was discharged on 10/30/2013 to SNF.

## 2013-10-30 NOTE — Progress Notes (Signed)
CSW contacted patient's daughter- Alvis Lemmings this morning to provide bed offers.  She will consider offers but is also interested in placement in [place]. [facility] is out of network for her insurance and CSW spoke with Lynnea Ferrier- Admissions at the [facility]. She will review referral and call back with possible bed offer.  Possible d/c today per discussion with Dr. Skeet Latch. Fl2 signed.  Lorri Frederick. Jaci Lazier, [place] [phone]

## 2013-10-30 NOTE — Progress Notes (Signed)
Clinical Social Worker facilitated patient discharge including contacting patient family and facility to confirm patient discharge plans.  Clinical information faxed to facility and family agreeable with plan. CSW will arrange ambulance transport via [facility] to [facility]. RN to call report prior to discharge. CSW discussed with patient and her daughters [place] and [place].   Clinical Social Worker will sign off for now as social work intervention is no longer needed. Please consult [place] again if new need arises.   Lovette Cliche, LCSW   [phone]

## 2014-01-21 DEATH — deceased

## 2015-09-15 ENCOUNTER — Encounter (HOSPITAL_COMMUNITY): Payer: Self-pay | Admitting: *Deleted

## 2016-04-05 IMAGING — CR DG CHEST 1V PORT
1 series · 1 of 1 positions shown · non-contrast
Comparison: Portable chest x-ray October 24, 2013

CLINICAL DATA: Follow-up pulmonary edema and pleural effusions

EXAM:
PORTABLE CHEST - 1 VIEW

[AP]
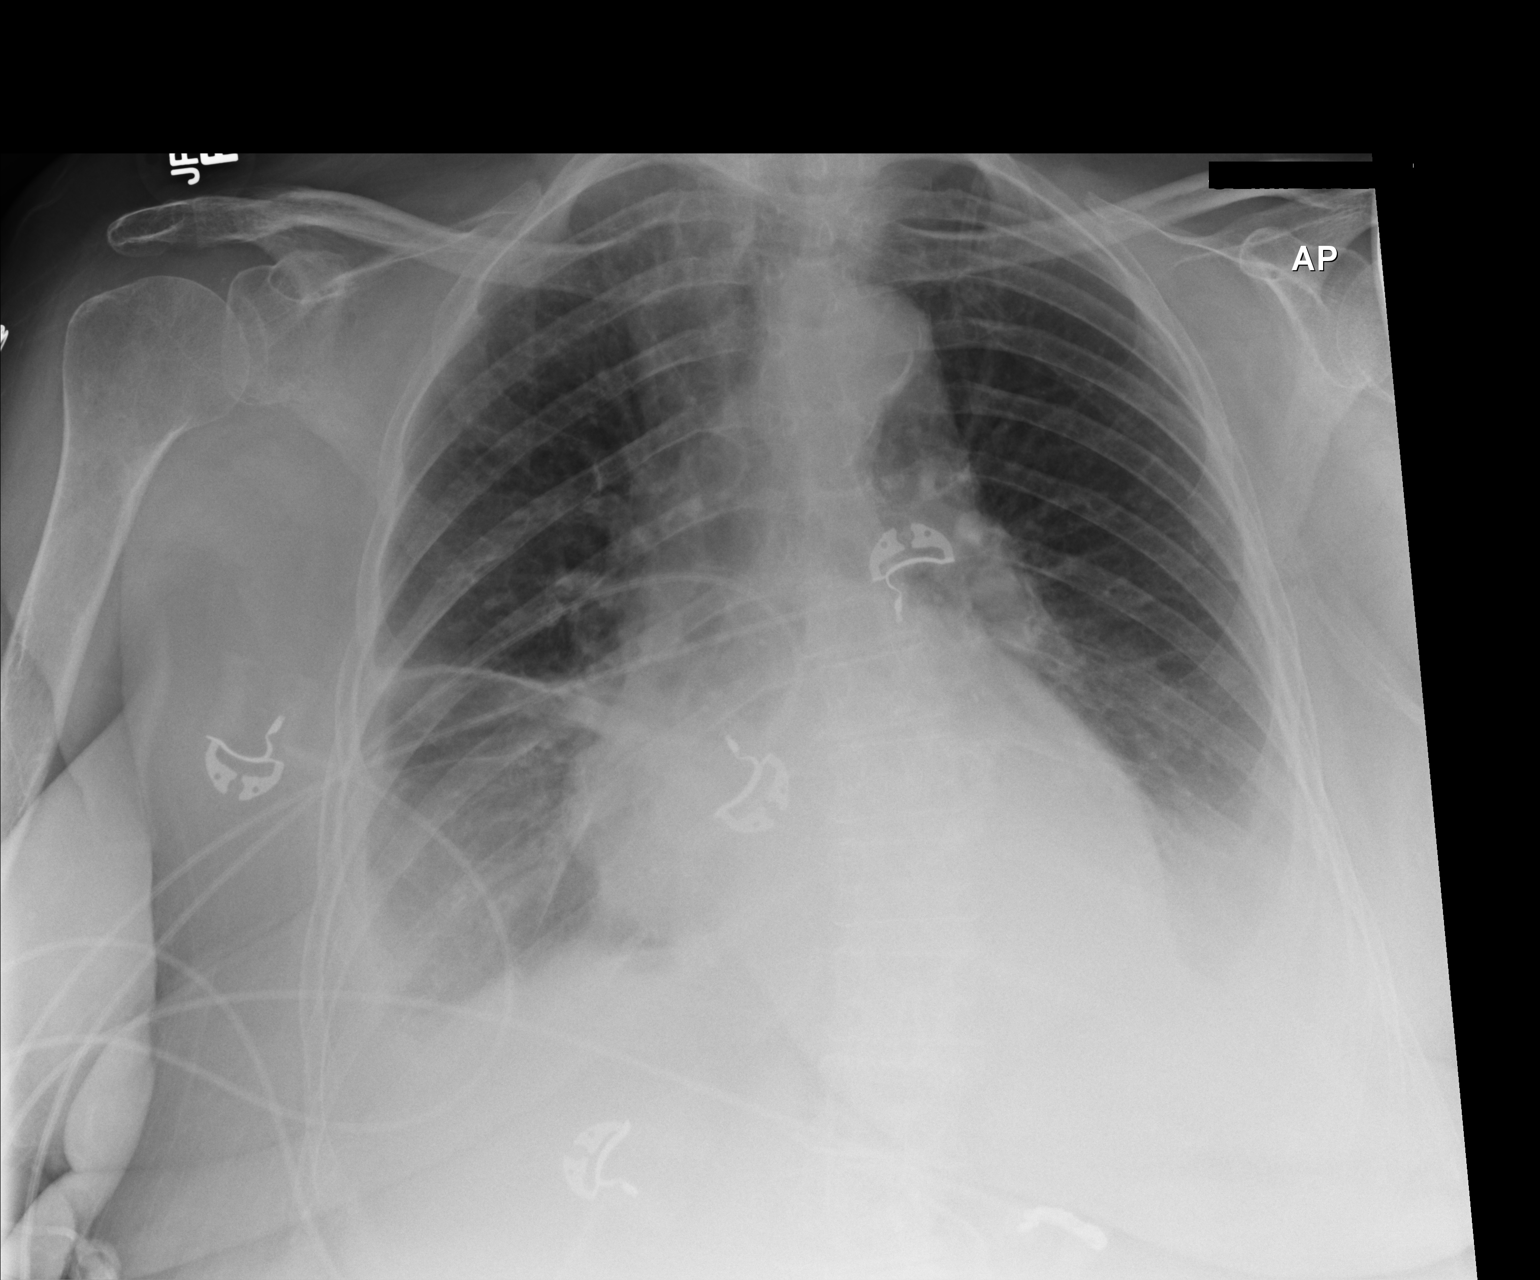

[1 of 1 positions shown; findings below may reference images not displayed]

FINDINGS: Again demonstrated are small bilateral pleural effusions layering
inferiorly. The left hemidiaphragm is largely obscured but stable.
The cardiac silhouette is top-normal in size. Mild central pulmonary
vascular prominence is present and stable. The mediastinum is normal
in width. There is gentle curvature of the thoracolumbar spine with
convexity towards the left.
IMPRESSION: Stable small bilateral pleural effusions likely secondary to CHF.
Stable left basilar atelectasis is suspected.
